# Patient Record
Sex: Male | Born: 1990 | Race: White | Hispanic: No | Marital: Single | Smoking: Never smoker
Health system: Southern US, Community
[De-identification: ages and names within clinical notes are randomized; demographics above are authoritative.]

## PROBLEM LIST (undated history)

## (undated) DIAGNOSIS — E669 Obesity, unspecified: Secondary | ICD-10-CM

## (undated) DIAGNOSIS — F329 Major depressive disorder, single episode, unspecified: Secondary | ICD-10-CM

## (undated) DIAGNOSIS — F419 Anxiety disorder, unspecified: Secondary | ICD-10-CM

## (undated) DIAGNOSIS — I10 Essential (primary) hypertension: Secondary | ICD-10-CM

## (undated) DIAGNOSIS — G4730 Sleep apnea, unspecified: Secondary | ICD-10-CM

## (undated) DIAGNOSIS — F840 Autistic disorder: Secondary | ICD-10-CM

## (undated) DIAGNOSIS — F32A Depression, unspecified: Secondary | ICD-10-CM

## (undated) HISTORY — DX: Obesity, unspecified: E66.9

## (undated) HISTORY — PX: NO PAST SURGERIES: SHX2092

## (undated) HISTORY — DX: Sleep apnea, unspecified: G47.30

## (undated) HISTORY — DX: Autistic disorder: F84.0

## (undated) HISTORY — DX: Depression, unspecified: F32.A

## (undated) HISTORY — DX: Essential (primary) hypertension: I10

## (undated) HISTORY — DX: Major depressive disorder, single episode, unspecified: F32.9

## (undated) HISTORY — DX: Anxiety disorder, unspecified: F41.9

---
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
## 2013-06-12 DIAGNOSIS — F840 Autistic disorder: Secondary | ICD-10-CM | POA: Insufficient documentation

## 2015-10-14 DIAGNOSIS — F419 Anxiety disorder, unspecified: Secondary | ICD-10-CM

## 2015-10-14 DIAGNOSIS — F329 Major depressive disorder, single episode, unspecified: Secondary | ICD-10-CM | POA: Insufficient documentation

## 2018-03-05 ENCOUNTER — Ambulatory Visit: Payer: 59 | Admitting: Physician Assistant

## 2018-03-05 ENCOUNTER — Encounter: Payer: Self-pay | Admitting: Physician Assistant

## 2018-03-05 VITALS — BP 158/96 | HR 76 | Temp 98.5°F | Resp 16 | Ht 72.0 in | Wt >= 6400 oz

## 2018-03-05 DIAGNOSIS — Z130 Encounter for screening for diseases of the blood and blood-forming organs and certain disorders involving the immune mechanism: Secondary | ICD-10-CM

## 2018-03-05 DIAGNOSIS — Z13220 Encounter for screening for lipoid disorders: Secondary | ICD-10-CM | POA: Diagnosis not present

## 2018-03-05 DIAGNOSIS — F329 Major depressive disorder, single episode, unspecified: Secondary | ICD-10-CM

## 2018-03-05 DIAGNOSIS — R29818 Other symptoms and signs involving the nervous system: Secondary | ICD-10-CM

## 2018-03-05 DIAGNOSIS — Z114 Encounter for screening for human immunodeficiency virus [HIV]: Secondary | ICD-10-CM

## 2018-03-05 DIAGNOSIS — F32A Depression, unspecified: Secondary | ICD-10-CM

## 2018-03-05 DIAGNOSIS — I10 Essential (primary) hypertension: Secondary | ICD-10-CM

## 2018-03-05 DIAGNOSIS — R7303 Prediabetes: Secondary | ICD-10-CM

## 2018-03-05 DIAGNOSIS — R5383 Other fatigue: Secondary | ICD-10-CM

## 2018-03-05 DIAGNOSIS — Z1329 Encounter for screening for other suspected endocrine disorder: Secondary | ICD-10-CM

## 2018-03-05 MED ORDER — AMLODIPINE BESYLATE 5 MG PO TABS: 5.0000 mg | ORAL_TABLET | Freq: Every day | ORAL | 3 refills | Status: DC

## 2018-03-05 MED ORDER — SERTRALINE HCL 50 MG PO TABS: 50.0000 mg | ORAL_TABLET | Freq: Every day | ORAL | 3 refills | Status: DC

## 2018-03-05 NOTE — Progress Notes (Signed)
Patient: Ryan Ellis Male    DOB: 08/24/1991   27 y.o.   MRN: [id number] Visit Date: 03/06/2018  Today's Provider: Trey SailorsAdriana M Pollak, PA-C   Chief Complaint  Patient presents with  . Establish Care   Subjective:    New patient here today. Last seen by PCP 2 years. [place], [place]. 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   Has previously lived in psychiatric living facility for 10 months and therapeutic foster home for 10 months. Was having emotional distress. Diagnosed with Asperger's at age 27 and had difficulty in home environment. [facility].   Prediabetes: Previously on metformin 1000 mg BID. Stopped taking two years. HTN: Used to be on 10 mg amlodipine, says he has had high blood pressure for 10 years.  Tetanus: Last given 2014, per patient  Depression and Anxiety: Previously on sertraline, did well on this medication. Previously on 200 mg QD zoloft.  Sleep apnea: Tested at age 27, reported not to have sleep apnea. Recently, has been snoring more frequently, trouble waking up, daytime fatigue.  Morbid Obesity: Reports his weight has always been high and weight loss has always been an issue for him. Lowest weight as an adult has been 344 in 2012. Has tried keto diet. Has considered bariatric surgery. Last weight 10/2015 was 398 lbs.   Hypertension  This is a chronic problem. The problem is unchanged. Associated symptoms include anxiety. There are no associated agents to hypertension.       Allergies  Allergen Reactions  . Amoxicillin Hives  . Cat Hair Extract     Other reaction(s): Unknown  . Cephalexin Hives  . Lamotrigine     Other reaction(s): Unknown SEVERE RASH THAT WAS LIKE SJ SYNDROME  . Lithium     Other reaction(s): Unknown Thyroid issues - increased?     Current Outpatient Medications:  .  amLODipine (NORVASC) 5 MG tablet, Take 1 tablet (5 mg total) by mouth daily., Disp: 90 tablet, Rfl: 3 .   sertraline (ZOLOFT) 50 MG tablet, Take 1 tablet (50 mg total) by mouth daily., Disp: 30 tablet, Rfl: 3  Review of Systems Past Surgical History:  Procedure Laterality Date  . NO PAST SURGERIES      Family History  Problem Relation Age of Onset  . Healthy Mother   . Seizures Father   . Hypertension Father   . Heart attack Maternal Grandmother   . Diabetes Maternal Grandmother   . Heart attack Maternal Grandfather     Social History   Tobacco Use  . Smoking status: Never Smoker  . Smokeless tobacco: Never Used  Substance Use Topics  . Alcohol use: Yes    Alcohol/week: 0.6 oz    Types: 1 Cans of beer per week   Objective:   BP (!) 158/96 (BP Location: Right Wrist, Patient Position: Sitting, Cuff Size: Normal)   Pulse 76   Temp 98.5 F (36.9 C) (Oral)   Resp 16   Ht 6' (1.829 m)   Wt (!) 467 lb (211.8 kg)   BMI 63.34 kg/m  Vitals:   03/05/18 1421  BP: (!) 158/96  Pulse: 76  Resp: 16  Temp: 98.5 F (36.9 C)  TempSrc: Oral  Weight: (!) 467 lb (211.8 kg)  Height: 6' (1.829 m)     Physical Exam  Constitutional: He is oriented to person, place, and time. He appears well-developed and well-nourished.  Obese.   HENT:  Right Ear: External ear normal.  Left Ear: External ear normal.  Mouth/Throat: No oropharyngeal exudate.  Eyes: Pupils are equal, round, and reactive to light. Conjunctivae are normal.  Neck: Neck supple.  Cardiovascular: Normal rate and regular rhythm.  Pulmonary/Chest: Effort normal and breath sounds normal.  Abdominal: Soft. Bowel sounds are normal.  Lymphadenopathy:    He has no cervical adenopathy.  Neurological: He is alert and oriented to person, place, and time.  Psychiatric: He has a normal mood and affect. His behavior is normal.        Assessment & Plan:     1. Hypertension, unspecified type  Restart amlodipine today. Will likely need to increase dose to 10 mg next visit. Needs EKG due to duration of HTN.   - amLODipine  (NORVASC) 5 MG tablet; Take 1 tablet (5 mg total) by mouth daily.  Dispense: 90 tablet; Refill: 3  2. Suspected sleep apnea  Order in lab study per patient preference. If taking a long time, will change to in home sleep study.  - Ambulatory referral to Sleep Studies  3. Screening for deficiency anemia  - CBC with Differential  4. Screening cholesterol level  - Lipid Profile  5. Encounter for screening for HIV  - HIV antibody (with reflex)  6. Thyroid disorder screening  - TSH  7. Morbid obesity (HCC)  Weigh is critical. Has gained about 70 lbs in two years, morbid obesity with resultant comorbid conditions. Discussed weight loss options including weight management clinic, Saxenda if prediabetic, appetite suppressants and consideration of bariatric surgery. Will touch base at 2 week follow up for blood pressure.  - HgB A1c - Comprehensive Metabolic Panel (CMET)  8. Prediabetes  - HgB A1c - Comprehensive Metabolic Panel (CMET)  9. Fatigue, unspecified type  - Ambulatory referral to Sleep Studies  10. Depression, unspecified depression type  Restart. Has been under psychiatric care and counseling in the past, likely will need consistent mental health provider.  - Ambulatory referral to Psychiatry - sertraline (ZOLOFT) 50 MG tablet; Take 1 tablet (50 mg total) by mouth daily.  Dispense: 30 tablet; Refill: 3  Return in about 2 weeks (around 03/19/2018) for HTN, EKG.  The entirety of the information documented in the History of Present Illness, Review of Systems and Physical Exam were personally obtained by me. Portions of this information were initially documented by Kavin Leech, CMA and reviewed by me for thoroughness and accuracy.         Trey Sailors, PA-C  [facility]

## 2018-03-06 ENCOUNTER — Encounter: Payer: Self-pay | Admitting: Physician Assistant

## 2018-03-07 LAB — CBC WITH DIFFERENTIAL/PLATELET
Basophils Absolute: 0 10*3/uL (ref 0.0–0.2)
Basos: 0 %
EOS (ABSOLUTE): 0.2 10*3/uL (ref 0.0–0.4)
Eos: 2 %
Hematocrit: 43 % (ref 37.5–51.0)
Hemoglobin: 15 g/dL (ref 13.0–17.7)
Immature Grans (Abs): 0 10*3/uL (ref 0.0–0.1)
Immature Granulocytes: 0 %
Lymphocytes Absolute: 2.8 10*3/uL (ref 0.7–3.1)
Lymphs: 36 %
MCH: 27.3 pg (ref 26.6–33.0)
MCHC: 34.9 g/dL (ref 31.5–35.7)
MCV: 78 fL — ABNORMAL LOW (ref 79–97)
Monocytes Absolute: 0.5 10*3/uL (ref 0.1–0.9)
Monocytes: 7 %
Neutrophils Absolute: 4.2 10*3/uL (ref 1.4–7.0)
Neutrophils: 55 %
Platelets: 216 10*3/uL (ref 150–450)
RBC: 5.5 x10E6/uL (ref 4.14–5.80)
RDW: 14.8 % (ref 12.3–15.4)
WBC: 7.8 10*3/uL (ref 3.4–10.8)

## 2018-03-07 LAB — COMPREHENSIVE METABOLIC PANEL
ALT: 41 IU/L (ref 0–44)
AST: 24 IU/L (ref 0–40)
Albumin/Globulin Ratio: 1.5 (ref 1.2–2.2)
Albumin: 4.3 g/dL (ref 3.5–5.5)
Alkaline Phosphatase: 64 IU/L (ref 39–117)
BUN/Creatinine Ratio: 14 (ref 9–20)
BUN: 10 mg/dL (ref 6–20)
Bilirubin Total: 0.4 mg/dL (ref 0.0–1.2)
CO2: 23 mmol/L (ref 20–29)
Calcium: 9.3 mg/dL (ref 8.7–10.2)
Chloride: 103 mmol/L (ref 96–106)
Creatinine, Ser: 0.71 mg/dL — ABNORMAL LOW (ref 0.76–1.27)
GFR calc Af Amer: 151 mL/min/{1.73_m2} (ref >59)
GFR calc non Af Amer: 130 mL/min/{1.73_m2} (ref >59)
Globulin, Total: 2.9 g/dL (ref 1.5–4.5)
Glucose: 96 mg/dL (ref 65–99)
Potassium: 4.4 mmol/L (ref 3.5–5.2)
Sodium: 142 mmol/L (ref 134–144)
Total Protein: 7.2 g/dL (ref 6.0–8.5)

## 2018-03-07 LAB — LIPID PANEL
Chol/HDL Ratio: 4.3 ratio (ref 0.0–5.0)
Cholesterol, Total: 180 mg/dL (ref 100–199)
HDL: 42 mg/dL (ref >39)
LDL Calculated: 118 mg/dL — ABNORMAL HIGH (ref 0–99)
Triglycerides: 100 mg/dL (ref 0–149)
VLDL Cholesterol Cal: 20 mg/dL (ref 5–40)

## 2018-03-07 LAB — HIV ANTIBODY (ROUTINE TESTING W REFLEX): HIV Screen 4th Generation wRfx: NONREACTIVE

## 2018-03-07 LAB — TSH: TSH: 1.67 u[IU]/mL (ref 0.450–4.500)

## 2018-03-07 LAB — HEMOGLOBIN A1C
Est. average glucose Bld gHb Est-mCnc: 108 mg/dL
Hgb A1c MFr Bld: 5.4 % (ref 4.8–5.6)

## 2018-03-11 ENCOUNTER — Telehealth: Payer: Self-pay

## 2018-03-11 NOTE — Telephone Encounter (Signed)
Pt returned call ° °teri °

## 2018-03-11 NOTE — Telephone Encounter (Signed)
LMTCB 03/11/2018  Thanks,   -Laura  

## 2018-03-11 NOTE — Telephone Encounter (Signed)
-----   Message from Trey SailorsAdriana M Pollak, [place]-C sent at 03/08/2018  8:45 AM EDT ----- A1c is normal, CMET normal. Cholesterol normal. CBC normal. HIV negative. TSH normal. See him back in clinic to check his blood pressure.

## 2018-03-11 NOTE — Telephone Encounter (Signed)
Left message advising pt.  (Per DPR)  Thanks,   -Laura  

## 2018-03-21 ENCOUNTER — Telehealth: Payer: Self-pay | Admitting: Physician Assistant

## 2018-03-21 ENCOUNTER — Encounter: Payer: Self-pay | Admitting: Physician Assistant

## 2018-03-21 ENCOUNTER — Ambulatory Visit: Payer: 59 | Admitting: Physician Assistant

## 2018-03-21 VITALS — BP 136/96 | HR 84 | Temp 98.8°F | Resp 16 | Wt >= 6400 oz

## 2018-03-21 DIAGNOSIS — R29818 Other symptoms and signs involving the nervous system: Secondary | ICD-10-CM

## 2018-03-21 DIAGNOSIS — F329 Major depressive disorder, single episode, unspecified: Secondary | ICD-10-CM

## 2018-03-21 DIAGNOSIS — F419 Anxiety disorder, unspecified: Secondary | ICD-10-CM

## 2018-03-21 DIAGNOSIS — I10 Essential (primary) hypertension: Secondary | ICD-10-CM

## 2018-03-21 MED ORDER — AMLODIPINE BESYLATE 10 MG PO TABS: 10.0000 mg | ORAL_TABLET | Freq: Every day | ORAL | 3 refills | Status: DC

## 2018-03-21 MED ORDER — SERTRALINE HCL 100 MG PO TABS: 100.0000 mg | ORAL_TABLET | Freq: Every day | ORAL | 0 refills | Status: DC

## 2018-03-21 NOTE — Telephone Encounter (Signed)
Pt stated that the he last saw Dr. Rowe Clackichard D'Alli with [facility] about 5 years ago but doesn't thinks provider is currently with [facility]. Pt stated that he was supposed to call back with the information and Ricki Rodriguezdriana is working on referring pt to another office for psychiatry. Please advise. Thanks TNP

## 2018-03-21 NOTE — Progress Notes (Signed)
Patient: Ryan Ellis Male    DOB: 10/30/1990   27 y.o.   MRN: [id number] Visit Date: 03/21/2018  Today's Provider: Trey SailorsAdriana M Pollak, PA-C   Chief Complaint  Patient presents with  . Hypertension  . Depression  . Anxiety   Subjective:     Hypertension  This is a chronic problem. Associated symptoms include anxiety. Pertinent negatives include no blurred vision, chest pain, headaches, malaise/fatigue, neck pain, orthopnea, palpitations, peripheral edema, PND, shortness of breath or sweats. There are no associated agents to hypertension. Risk factors for coronary artery disease include male gender, obesity and family history. Past treatments include nothing. There are no compliance problems.   Anxiety  Presents for follow-up visit. Symptoms include decreased concentration, depressed mood, excessive worry and nervous/anxious behavior. Patient reports no chest pain, confusion, dizziness, insomnia, nausea, palpitations, shortness of breath or suicidal ideas. The quality of sleep is fair. Nighttime awakenings: several.     BP Readings from Last 3 Encounters:  03/21/18 (!) 136/96  03/05/18 (!) 158/96   Wt Readings from Last 3 Encounters:  03/21/18 (!) 464 lb (210.5 kg)  03/05/18 (!) 467 lb (211.8 kg)       Allergies  Allergen Reactions  . Amoxicillin Hives  . Cat Hair Extract     Other reaction(s): Unknown  . Cephalexin Hives  . Lamotrigine     Other reaction(s): Unknown SEVERE RASH THAT WAS LIKE SJ SYNDROME  . Lithium     Other reaction(s): Unknown Thyroid issues - increased?     Current Outpatient Medications:  .  amLODipine (NORVASC) 10 MG tablet, Take 1 tablet (10 mg total) by mouth daily., Disp: 90 tablet, Rfl: 3 .  sertraline (ZOLOFT) 100 MG tablet, Take 1 tablet (100 mg total) by mouth daily., Disp: 90 tablet, Rfl: 0  Review of Systems  Constitutional: Negative.  Negative for malaise/fatigue.  Eyes: Negative for blurred vision.  Respiratory: Negative.   Negative for shortness of breath.   Cardiovascular: Negative.  Negative for chest pain, palpitations, orthopnea and PND.  Gastrointestinal: Negative.  Negative for nausea.  Musculoskeletal: Negative for neck pain.  Neurological: Negative for dizziness, light-headedness and headaches.  Psychiatric/Behavioral: Positive for decreased concentration, dysphoric mood and sleep disturbance. Negative for agitation, behavioral problems, confusion, hallucinations, self-injury and suicidal ideas. The patient is nervous/anxious. The patient does not have insomnia and is not hyperactive.     Social History   Tobacco Use  . Smoking status: Never Smoker  . Smokeless tobacco: Never Used  Substance Use Topics  . Alcohol use: Yes    Alcohol/week: 0.6 oz    Types: 1 Cans of beer per week   Objective:   BP (!) 136/96 (BP Location: Right Arm, Patient Position: Sitting, Cuff Size: Large)   Pulse 84   Temp 98.8 F (37.1 C) (Oral)   Resp 16   Wt (!) 464 lb (210.5 kg)   BMI 62.93 kg/m  Vitals:   03/21/18 1409  BP: (!) 136/96  Pulse: 84  Resp: 16  Temp: 98.8 F (37.1 C)  TempSrc: Oral  Weight: (!) 464 lb (210.5 kg)     Physical Exam  Constitutional: He is oriented to person, place, and time. He appears well-developed and well-nourished.  Cardiovascular: Normal rate and regular rhythm.  Pulmonary/Chest: Effort normal and breath sounds normal.  Musculoskeletal: Normal range of motion. He exhibits no edema.  Neurological: He is alert and oriented to person, place, and time.  Skin: Skin is warm and  dry.  Psychiatric: He has a normal mood and affect. His behavior is normal.        Assessment & Plan:     1. Essential hypertension  Improved but not controlled yet. Will increase amlodipine to 10 mg QD. EKG today looks normal with no LVH.   - EKG 12-Lead - amLODipine (NORVASC) 10 MG tablet; Take 1 tablet (10 mg total) by mouth daily.  Dispense: 90 tablet; Refill: 3  2. Anxiety and  depression  Increase zoloft to 100 mg. Tried to refer to psychiatry, they are requesting records before deciding whether or not to take this patient. We will have him follow up with a therapist as he feels this would be more helpful to him and for now I can manage his medications.  - Ambulatory referral to Psychology - sertraline (ZOLOFT) 100 MG tablet; Take 1 tablet (100 mg total) by mouth daily.  Dispense: 90 tablet; Refill: 0  3. Suspected sleep apnea  He is scheduled for a sleep study on 03/29/2018.   Return in about 1 month (around 04/21/2018) for HTN; anxiety and depression .  The entirety of the information documented in the History of Present Illness, Review of Systems and Physical Exam were personally obtained by me. Portions of this information were initially documented by Kavin Leech, CMA and reviewed by me for thoroughness and accuracy.          Trey Sailors, PA-C  [facility]

## 2018-03-21 NOTE — Patient Instructions (Signed)

## 2018-03-29 ENCOUNTER — Ambulatory Visit: Payer: 59 | Attending: Otolaryngology

## 2018-03-29 DIAGNOSIS — G4733 Obstructive sleep apnea (adult) (pediatric): Secondary | ICD-10-CM | POA: Diagnosis not present

## 2018-03-29 DIAGNOSIS — F5101 Primary insomnia: Secondary | ICD-10-CM | POA: Insufficient documentation

## 2018-03-29 DIAGNOSIS — G4730 Sleep apnea, unspecified: Secondary | ICD-10-CM | POA: Diagnosis present

## 2018-04-03 ENCOUNTER — Other Ambulatory Visit: Payer: Self-pay | Admitting: Physician Assistant

## 2018-04-03 DIAGNOSIS — G4730 Sleep apnea, unspecified: Secondary | ICD-10-CM | POA: Insufficient documentation

## 2018-04-16 ENCOUNTER — Telehealth: Payer: Self-pay | Admitting: Physician Assistant

## 2018-04-16 NOTE — Telephone Encounter (Signed)
Victorino DikeJennifer with [employer] called saying she faxed a letter of medical necessity for a C pap machine and pressure settings.  They have not recieved it back.  Can we confirm that we received the fax and can you sign and send it back in.    [place]side LakeJennifer with [employer] said this the 4th time she has requested the form.  CB to [employer]  [phone]  Thanks Barth Kirksteri

## 2018-04-18 NOTE — Telephone Encounter (Signed)
This has been faxed again.   Thanks,   -Vernona RiegerLaura

## 2018-04-19 ENCOUNTER — Ambulatory Visit: Payer: 59 | Admitting: Psychology

## 2018-04-19 ENCOUNTER — Ambulatory Visit: Payer: 59 | Admitting: Physician Assistant

## 2018-04-19 ENCOUNTER — Encounter: Payer: Self-pay | Admitting: Physician Assistant

## 2018-04-19 DIAGNOSIS — F840 Autistic disorder: Secondary | ICD-10-CM

## 2018-04-19 DIAGNOSIS — I10 Essential (primary) hypertension: Secondary | ICD-10-CM | POA: Diagnosis not present

## 2018-04-19 DIAGNOSIS — G4730 Sleep apnea, unspecified: Secondary | ICD-10-CM | POA: Diagnosis not present

## 2018-04-19 DIAGNOSIS — F329 Major depressive disorder, single episode, unspecified: Secondary | ICD-10-CM | POA: Diagnosis not present

## 2018-04-19 MED ORDER — LISINOPRIL-HYDROCHLOROTHIAZIDE 10-12.5 MG PO TABS: 1.0000 | ORAL_TABLET | Freq: Every day | ORAL | 0 refills | Status: DC

## 2018-04-19 NOTE — Patient Instructions (Signed)

## 2018-04-19 NOTE — Progress Notes (Signed)
Patient: Ryan Ellis Male    DOB: 12/19/1990   27 y.o.   MRN: [id number] Visit Date: 04/19/2018  Today's Provider: Trey SailorsAdriana M Pollak, PA-C   Chief Complaint  Patient presents with  . Hypertension  . Depression   Subjective:    Hypertension  This is a chronic problem. Associated symptoms include anxiety. Pertinent negatives include no blurred vision, headaches, malaise/fatigue, palpitations, peripheral edema or shortness of breath. There are no compliance problems.   Depression         This is a chronic problem.  Associated symptoms include fatigue, helplessness, hopelessness and restlessness.  Associated symptoms include no decreased concentration, does not have insomnia, not irritable, no decreased interest, no appetite change, no myalgias, no headaches, no indigestion, not sad and no suicidal ideas.  Past medical history includes anxiety.    Has missed one dose in the past week of amlodipine 10 mg. Otherwise doing well with the medication. Has lost ~ 10 pounds over the past two months.  Recently followed up with therapy, has appointment with psychiatrist.   Wt Readings from Last 3 Encounters:  04/19/18 (!) 458 lb (207.7 kg)  03/21/18 (!) 464 lb (210.5 kg)  03/05/18 (!) 467 lb (211.8 kg)   BP Readings from Last 3 Encounters:  04/19/18 (!) 148/96  03/21/18 (!) 136/96  03/05/18 (!) 158/96        Allergies  Allergen Reactions  . Amoxicillin Hives  . Cat Hair Extract     Other reaction(s): Unknown  . Cephalexin Hives  . Lamotrigine     Other reaction(s): Unknown SEVERE RASH THAT WAS LIKE SJ SYNDROME  . Lithium     Other reaction(s): Unknown Thyroid issues - increased?     Current Outpatient Medications:  .  amLODipine (NORVASC) 10 MG tablet, Take 1 tablet (10 mg total) by mouth daily., Disp: 90 tablet, Rfl: 3 .  sertraline (ZOLOFT) 100 MG tablet, Take 1 tablet (100 mg total) by mouth daily., Disp: 90 tablet, Rfl: 0  Review of Systems  Constitutional: Positive  for diaphoresis and fatigue. Negative for activity change, appetite change, chills, fever, malaise/fatigue and unexpected weight change.  Eyes: Negative for blurred vision.  Respiratory: Positive for apnea. Negative for cough, choking, chest tightness, shortness of breath, wheezing and stridor.   Cardiovascular: Negative.  Negative for palpitations.  Gastrointestinal: Negative.   Musculoskeletal: Negative for myalgias.  Neurological: Negative for dizziness, light-headedness and headaches.  Psychiatric/Behavioral: Positive for depression and dysphoric mood. Negative for agitation, behavioral problems, confusion, decreased concentration, hallucinations, self-injury, sleep disturbance and suicidal ideas. The patient is nervous/anxious. The patient does not have insomnia and is not hyperactive.     Social History   Tobacco Use  . Smoking status: Never Smoker  . Smokeless tobacco: Never Used  Substance Use Topics  . Alcohol use: Yes    Alcohol/week: 1.0 standard drinks    Types: 1 Cans of beer per week   Objective:   BP (!) 148/96 (BP Location: Right Wrist, Patient Position: Sitting, Cuff Size: Normal)   Pulse (!) 106   Temp 98.2 F (36.8 C)   Wt (!) 458 lb (207.7 kg)   SpO2 95%   BMI 62.12 kg/m  Vitals:   04/19/18 1426  BP: (!) 148/96  Pulse: (!) 106  Temp: 98.2 F (36.8 C)  SpO2: 95%  Weight: (!) 458 lb (207.7 kg)     Physical Exam  Constitutional: He is oriented to person, place, and time. He appears  well-developed and well-nourished. He is not irritable.  Cardiovascular: Normal rate and regular rhythm.  Pulmonary/Chest: Effort normal and breath sounds normal.  Neurological: He is alert and oriented to person, place, and time.  Skin: Skin is warm and dry.  Psychiatric: He has a normal mood and affect. His behavior is normal.        Assessment & Plan:     1. Morbid obesity [facility](HCC)  Bariatric surgery evaluation.  - Ambulatory referral to General Surgery  2.  Essential hypertension  Add medication.   - lisinopril-hydrochlorothiazide (PRINZIDE,ZESTORETIC) 10-12.5 MG tablet; Take 1 tablet by mouth daily.  Dispense: 90 tablet; Refill: 0  3. Sleep apnea, unspecified type  Sleep study showed OSA. In the process of ordering auto-titrating CPAP.   Return in about 1 month (around 05/20/2018) for HTN.  The entirety of the information documented in the History of Present Illness, Review of Systems and Physical Exam were personally obtained by me. Portions of this information were initially documented by Kavin LeechLaura Walsh, CMA and reviewed by me for thoroughness and accuracy.          Trey SailorsAdriana M Pollak, PA-C  [facility]

## 2018-04-22 ENCOUNTER — Telehealth: Payer: Self-pay | Admitting: Physician Assistant

## 2018-04-22 NOTE — Telephone Encounter (Signed)
Patient called saying someone called him for an appt. With a Therapist, sportssychiatrist for Wed. Aug. 21, 2019 at 12:00.  However, he does not know the name, address or phone # of the Doc. He is suppose to see and he has a mandatory meeting on Wed and cannot go to Psych appt.   Pt does not know how to get in touch with psych because they never emailed info to him as was instructed by the Psych office.     Is there anyway we can find out TODAY who the appt was with because he is trying to resch within the 24 hour period before being charged a NO SHOW.

## 2018-04-22 NOTE — Telephone Encounter (Signed)
Please advise.  I'm not sure who this pt is seeing.   Thanks,   -Vernona RiegerLaura

## 2018-04-22 NOTE — Telephone Encounter (Signed)
Scheduled with Dr. Elna BreslowEappen on 04/24/2018. Please give contact info for [facility] and have him reschedule.

## 2018-04-23 NOTE — Telephone Encounter (Signed)
Pt advised.   Thanks,   -Jayceion Lisenby  

## 2018-04-24 ENCOUNTER — Ambulatory Visit: Payer: Self-pay | Admitting: Psychiatry

## 2018-04-30 ENCOUNTER — Ambulatory Visit: Payer: 59 | Admitting: Psychology

## 2018-04-30 DIAGNOSIS — F3289 Other specified depressive episodes: Secondary | ICD-10-CM | POA: Diagnosis not present

## 2018-05-14 ENCOUNTER — Encounter: Payer: Self-pay | Admitting: Psychiatry

## 2018-05-14 ENCOUNTER — Ambulatory Visit: Payer: 59 | Admitting: Psychology

## 2018-05-14 ENCOUNTER — Ambulatory Visit: Payer: 59 | Admitting: Psychiatry

## 2018-05-14 VITALS — BP 155/82 | HR 83 | Temp 97.6°F | Ht 72.0 in | Wt >= 6400 oz

## 2018-05-14 DIAGNOSIS — F5105 Insomnia due to other mental disorder: Secondary | ICD-10-CM | POA: Diagnosis not present

## 2018-05-14 DIAGNOSIS — F411 Generalized anxiety disorder: Secondary | ICD-10-CM | POA: Diagnosis not present

## 2018-05-14 DIAGNOSIS — G4733 Obstructive sleep apnea (adult) (pediatric): Secondary | ICD-10-CM | POA: Insufficient documentation

## 2018-05-14 DIAGNOSIS — F3289 Other specified depressive episodes: Secondary | ICD-10-CM | POA: Diagnosis not present

## 2018-05-14 DIAGNOSIS — F840 Autistic disorder: Secondary | ICD-10-CM | POA: Diagnosis not present

## 2018-05-14 DIAGNOSIS — F331 Major depressive disorder, recurrent, moderate: Secondary | ICD-10-CM | POA: Diagnosis not present

## 2018-05-14 DIAGNOSIS — Z9989 Dependence on other enabling machines and devices: Secondary | ICD-10-CM

## 2018-05-14 MED ORDER — SERTRALINE HCL 25 MG PO TABS: 25.0000 mg | ORAL_TABLET | Freq: Every day | ORAL | 0 refills | Status: DC

## 2018-05-14 MED ORDER — ARIPIPRAZOLE 2 MG PO TABS: 2.0000 mg | ORAL_TABLET | Freq: Every day | ORAL | 1 refills | Status: DC

## 2018-05-14 MED ORDER — PROPRANOLOL HCL 10 MG PO TABS: 10.0000 mg | ORAL_TABLET | Freq: Two times a day (BID) | ORAL | 1 refills | Status: DC | PRN

## 2018-05-14 NOTE — Patient Instructions (Signed)
Aripiprazole tablets What is this medicine? ARIPIPRAZOLE (ay ri PIP ray zole) is an atypical antipsychotic. It is used to treat schizophrenia and bipolar disorder, also known as manic-depression. It is also used to treat Tourette's disorder and some symptoms of autism. This medicine may also be used in combination with antidepressants to treat major depressive disorder. This medicine may be used for other purposes; ask your health care provider or pharmacist if you have questions. COMMON BRAND NAME(S): Abilify What should I tell my health care provider before I take this medicine? They need to know if you have any of these conditions: -dehydration -dementia -diabetes -heart disease -history of stroke -low blood counts, like low white cell, platelet, or red cell counts -Parkinson's disease -seizures -suicidal thoughts, plans, or attempt; a previous suicide attempt by you or a family member -an unusual or allergic reaction to aripiprazole, other medicines, foods, dyes, or preservatives -pregnant or trying to get pregnant -breast-feeding How should I use this medicine? Take this medicine by mouth with a glass of water. Follow the directions on the prescription label. You can take this medicine with or without food. Take your doses at regular intervals. Do not take your medicine more often than directed. Do not stop taking except on the advice of your doctor or health care professional. A special MedGuide will be given to you by the pharmacist with each prescription and refill. Be sure to read this information carefully each time. Talk to your pediatrician regarding the use of this medicine in children. While this drug may be prescribed for children as young as 6 years of age for selected conditions, precautions do apply. Overdosage: If you think you have taken too much of this medicine contact a poison control center or emergency room at once. NOTE: This medicine is only for you. Do not share  this medicine with others. What if I miss a dose? If you miss a dose, take it as soon as you can. If it is almost time for your next dose, take only that dose. Do not take double or extra doses. What may interact with this medicine? Do not take this medicine with any of the following medications: -brexpiprazole -cisapride -dofetilide -dronedarone -metoclopramide -pimozide -thioridazine This medicine may also interact with the following medications: -alcohol -carbamazepine -certain medicines for anxiety or sleep -certain medicines for blood pressure -certain medicines for fungal infections like ketoconazole, fluconazole, posaconazole, and itraconazole -clarithromycin -fluoxetine -other medicines that prolong the QT interval (cause an abnormal heart rhythm) -paroxetine -quinidine -rifampin This list may not describe all possible interactions. Give your health care provider a list of all the medicines, herbs, non-prescription drugs, or dietary supplements you use. Also tell them if you smoke, drink alcohol, or use illegal drugs. Some items may interact with your medicine. What should I watch for while using this medicine? Visit your doctor or health care professional for regular checks on your progress. It may be several weeks before you see the full effects of this medicine. Do not suddenly stop taking this medicine. You may need to gradually reduce the dose. Patients and their families should watch out for worsening depression or thoughts of suicide. Also watch out for sudden changes in feelings such as feeling anxious, agitated, panicky, irritable, hostile, aggressive, impulsive, severely restless, overly excited and hyperactive, or not being able to sleep. If this happens, especially at the beginning of antidepressant treatment or after a change in dose, call your health care professional. You may get dizzy or drowsy. Do   not drive, use machinery, or do anything that needs mental  alertness until you know how this medicine affects you. Do not stand or sit up quickly, especially if you are an older patient. This reduces the risk of dizzy or fainting spells. Alcohol can increase dizziness and drowsiness. Avoid alcoholic drinks. This medicine can reduce the response of your body to heat or cold. Dress warm in cold weather and stay hydrated in hot weather. If possible, avoid extreme temperatures like saunas, hot tubs, very hot or cold showers, or activities that can cause dehydration such as vigorous exercise. This medicine may cause dry eyes and blurred vision. If you wear contact lenses you may feel some discomfort. Lubricating drops may help. See your eye doctor if the problem does not go away or is severe. If you notice an increased hunger or thirst, different from your normal hunger or thirst, or if you find that you have to urinate more frequently, you should contact your health care provider as soon as possible. You may need to have your blood sugar monitored. This medicine may cause changes in your blood sugar levels. You should monitor you blood sugar frequently if you have diabetes. There have been reports of uncontrollable and strong urges to gamble, binge eat, shop, and have sex while taking this medicine. If you experience any of these or other uncontrollable and strong urges while taking this medicine, you should report it to your health care provider as soon as possible. What side effects may I notice from receiving this medicine? Side effects that you should report to your doctor or health care professional as soon as possible: -allergic reactions like skin rash, itching or hives, swelling of the face, lips, or tongue -breathing problems -confusion -feeling faint or lightheaded, falls -fever or chills, sore throat -increased hunger or thirst -increased urination -joint pain -muscles pain, spasms -problems with balance, talking, walking -restlessness or need to  keep moving -seizures -suicidal thoughts or other mood changes -trouble swallowing -uncontrollable and excessive urges (examples: gambling, binge eating, shopping, having sex) -uncontrollable head, mouth, neck, arm, or leg movements -unusually weak or tired Side effects that usually do not require medical attention (report to your doctor or health care professional if they continue or are bothersome): -blurred vision -constipation -headache -nausea, vomiting -trouble sleeping -weight gain This list may not describe all possible side effects. Call your doctor for medical advice about side effects. You may report side effects to FDA at 1-800-FDA-1088. Where should I keep my medicine? Keep out of the reach of children. Store at room temperature between 15 and 30 degrees C (59 and 86 degrees F). Throw away any unused medicine after the expiration date. NOTE: This sheet is a summary. It may not cover all possible information. If you have questions about this medicine, talk to your doctor, pharmacist, or health care provider.  2018 Elsevier/Gold Standard (2016-08-06 11:45:05)  

## 2018-05-14 NOTE — Progress Notes (Signed)
Psychiatric Initial Adult Assessment   Patient Identification: Ryan Ellis MRN:  [id number] Date of Evaluation:  05/14/2018 Referral Source: Nettie Elm PA -C Chief Complaint:  ' I am here to establish care." Chief Complaint    Establish Care; Depression; Anxiety; Panic Attack; Stress; Fatigue     Visit Diagnosis:    ICD-10-CM   1. MDD (major depressive disorder), recurrent episode, moderate (HCC) F33.1 sertraline (ZOLOFT) 25 MG tablet    ARIPiprazole (ABILIFY) 2 MG tablet  2. GAD (generalized anxiety disorder) F41.1 ARIPiprazole (ABILIFY) 2 MG tablet  3. Autism spectrum disorder F84.0 ARIPiprazole (ABILIFY) 2 MG tablet  4. Insomnia due to mental condition F51.05     History of Present Illness:  Ryan Ellis is a 27 year old Caucasian male, single, employed, lives in [place], has a history of depression, anxiety disorder, autism spectrum disorder, OSA on CPAP, hypertension, morbid obesity, presented to the clinic today to establish care.  Patient reports he has been struggling with depression and anxiety since the past more than 10 years.  He reports he was started on Zoloft 4 years ago by [facility] psychiat[facility].  He reports his dosage was increased to 100 mg by his primary medical doctor 5 months ago.  He however reports he continues to struggle with depressive symptoms like sadness, lack of motivation, feeling lonely, negative self-image, inability to concentrate, sleep issues and so on.  He denies any suicidality.  He denies any perceptual disturbances.  He denies any homicidality.  He does report anxiety symptoms.  He describes his anxiety symptoms as generalized worry about things in general, feeling nervous or on edge, worrying too much about different things, trouble relaxing, being restless and so on.  He reports that the Zoloft may be helping to some extent with his anxiety symptoms since  his symptoms have improved since being on the higher dosage.  He also reports a history of panic attacks  when he feels extremely anxious has racing heart rate and feel nervous.  He reports he was prescribed propranolol as needed which helps to some extent.  He reports sleep is improved now that he has started using his CPAP for OSA.  He reports he was diagnosed with OSA 2 weeks ago and started using the CPAP.  He does report some mood lability.  He reports there are times when he has an Engineer, production and is unable to manage his mood symptoms.  He reports that does not happen very frequently however he believes it is also due to his autism spectrum disorder.  He has tried mood stabilizers like lithium, Lamictal, Depakote and Abilify in the past.  He has had allergic reaction to lithium and Lamictal and he does not think the Depakote worked.  He reports he is okay with trying the Abilify again.  He denies any manic or hypomanic symptoms.  He denies any perceptual disturbances.  He does report some traumatic experiences growing up.  He reports he was in a residential treatment facility years ago which was a traumatic experience for him.  He also reports relationship struggles with his parents growing up which was also traumatic to him.  He currently denies any significant PTSD symptoms.  He is currently in psychotherapy with therapist Ryan Ellis which he reports is working well.  He denies abusing any substances or alcohol or other illicit drugs.  Associated Signs/Symptoms: Depression Symptoms:  depressed mood, anhedonia, fatigue, difficulty concentrating, anxiety, loss of energy/fatigue, (Hypo) Manic Symptoms:  Labiality of Mood, Anxiety Symptoms:  Excessive Worry, Panic  Symptoms, Psychotic Symptoms:  denies PTSD Symptoms: Had a traumatic exposure:  as noted above  Past Psychiatric History: Patient reports at least 4 inpatient mental health admissions in the past. One of those admission was at a residential long-term treatment facility where he stayed for several months.  Patient  reports this was in [place] and was 10 years ago.  Patient denies any suicide attempts.  Patient used to be under the care of [facility] psychiat[facility] in the past.  Patient's medications were recently being managed by his primary medical doctor.  Previous Psychotropic Medications: Yes Zoloft, Depakote, Lamictal ( rash) , lithium ( Thyroid problems) ,abilify, propranolol  Substance Abuse History in the last 12 months:  No.  Consequences of Substance Abuse: Negative  Past Medical History:  Past Medical History:  Diagnosis Date  . Anxiety   . Autism   . Depression   . Hypertension   . Obesity     Past Surgical History:  Procedure Laterality Date  . NO PAST SURGERIES      Family Psychiatric History: Maternal great grandmother-schizophrenia, committed suicide, mother-mental illness, alcoholism, father-mental illness, sister-depression, paternal grandmother-mental illness, alcoholism.  Family History:  Family History  Problem Relation Age of Onset  . Healthy Mother   . Alcohol abuse Mother   . Anxiety disorder Mother   . Depression Mother   . Seizures Father   . Hypertension Father   . Anxiety disorder Father   . Depression Father   . Heart attack Maternal Grandmother   . Diabetes Maternal Grandmother   . Heart attack Maternal Grandfather   . Anxiety disorder Sister   . Depression Sister     Social History:   Social History   Socioeconomic History  . Marital status: Single    Spouse name: Not on file  . Number of children: 0  . Years of education: Not on file  . Highest education level: Associate degree: occupational, Scientist, product/process development, or vocational program  Occupational History  . Not on file  Social Needs  . Financial resource strain: Not hard at all  . Food insecurity:    Worry: Never true    Inability: Never true  . Transportation needs:    Medical: No    Non-medical: No  Tobacco Use  . Smoking status: Never Smoker  . Smokeless tobacco: Never Used  Substance  and Sexual Activity  . Alcohol use: Not Currently    Alcohol/week: 1.0 standard drinks    Types: 1 Cans of beer per week  . Drug use: Never  . Sexual activity: Not Currently  Lifestyle  . Physical activity:    Days per week: 0 days    Minutes per session: 0 min  . Stress: Not at all  Relationships  . Social connections:    Talks on phone: Once a week    Gets together: Once a week    Attends religious service: Never    Active member of club or organization: Yes    Attends meetings of clubs or organizations: More than 4 times per year    Relationship status: Never married  Other Topics Concern  . Not on file  Social History Narrative  . Not on file    Additional Social History: Pt is single.  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  He lives by self in [place].  He has both parents and sister who currently lives in [place].  He reports an okay relationship with them.  Allergies:   Allergies  Allergen Reactions  . Amoxicillin Hives  . Cat Hair Extract     Other reaction(s): Unknown  . Cephalexin Hives  . Lamotrigine     Other reaction(s): Unknown SEVERE RASH THAT WAS LIKE SJ SYNDROME  . Lithium     Other reaction(s): Unknown Thyroid issues - increased?    Metabolic Disorder Labs: Lab Results  Component Value Date   HGBA1C 5.4 03/06/2018   No results found for: PROLACTIN Lab Results  Component Value Date   CHOL 180 03/06/2018   TRIG 100 03/06/2018   HDL 42 03/06/2018   CHOLHDL 4.3 03/06/2018   LDLCALC 118 (H) 03/06/2018     Current Medications: Current Outpatient Medications  Medication Sig Dispense Refill  . amLODipine (NORVASC) 10 MG tablet Take 1 tablet (10 mg total) by mouth daily. 90 tablet 3  . ARIPiprazole (ABILIFY) 2 MG tablet Take 1 tablet (2 mg total) by mouth daily. For mood 30 tablet 1  . lisinopril-hydrochlorothiazide (PRINZIDE,ZESTORETIC) 10-12.5 MG tablet Take 1 tablet by mouth daily. 90  tablet 0  . propranolol (INDERAL) 10 MG tablet Take 1 tablet (10 mg total) by mouth 2 (two) times daily as needed. Only for severe anxiety attacks 60 tablet 1  . sertraline (ZOLOFT) 100 MG tablet Take 1 tablet (100 mg total) by mouth daily. 90 tablet 0  . sertraline (ZOLOFT) 25 MG tablet Take 1 tablet (25 mg total) by mouth daily. Combine with 100 mg 30 tablet 0   No current facility-administered medications for this visit.     Neurologic: Headache: No Seizure: No Paresthesias:No  Musculoskeletal: Strength & Muscle Tone: within normal limits Gait & Station: normal Patient leans: N/A  Psychiatric Specialty Exam: Review of Systems  Psychiatric/Behavioral: Positive for depression. The patient has insomnia.   All other systems reviewed and are negative.   Blood pressure (!) 155/82, pulse 83, temperature 97.6 F (36.4 C), temperature source Oral, height 6' (1.829 m), weight (!) 464 lb (210.5 kg).Body mass index is 62.93 kg/m.  General Appearance: Casual  Eye Contact:  Fair  Speech:  Normal Rate  Volume:  Normal  Mood:  Anxious and Dysphoric  Affect:  Depressed  Thought Process:  Goal Directed and Descriptions of Associations: Intact  Orientation:  Full (Time, Place, and Person)  Thought Content:  Logical  Suicidal Thoughts:  No  Homicidal Thoughts:  No  Memory:  Immediate;   Fair Recent;   Fair Remote;   Fair  Judgement:  Fair  Insight:  Fair  Psychomotor Activity:  Normal  Concentration:  Concentration: Fair and Attention Span: Fair  Recall:  [employer] of Knowledge:Fair  Language: Fair  Akathisia:  No  Handed:  Right  AIMS (if indicated): na  Assets:  Communication Skills Desire for Improvement Social Support  ADL's:  Intact  Cognition: WNL  Sleep:  improving    Treatment Plan Summary:Trayson is a 27 year old Caucasian male, single, lives in [place], employed, has a history of depression, anxiety, autism spectrum disorder, OSA on CPAP, hypertension, morbid  obesity, presented to the clinic today to establish care.  Patient is biologically predisposed given his multiple medical problems as well as history of autism spectrum disorder and family history of mental health problems.  Patient also has psychosocial stressors of relationship conflicts.  Patient however is highly motivated to stay on medications as well as continue psychotherapy.  He currently denies any substance abuse problems or suicidality and homicidality.  Plan as noted below. Medication management and  Plan as noted below Plan For MDD PHQ 9 equals 15 Increase Zoloft to 125 mg p.o. daily Add Abilify 2 mg p.o. daily Continue psychotherapy with therapist Ryan Ellis.  For GAD GAD 7 equals 10 Zoloft 125 mg p.o. daily Continue CBT  For insomnia Currently on CPAP for OSA which helps his sleep.  For panic symptoms Propanolol 10 mg p.o. twice daily as needed Continue CBT  For autism spectrum disorder Continue CBT Abilify for mood symptoms.  I have reviewed the following labs in [facility]- TSH-1.670, Lipid Panel-within normal limits, hemoglobin A1c- 5.4 -completed on 03/06/2018-all within normal limits  Pt with elevated blood pressure reading today we will continue to work with his primary medical doctor.  Follow-up in clinic in 4 weeks or sooner if needed.  More than 50 % of the time was spent for psychoeducation and supportive psychotherapy and care coordination.  This note was generated in part or whole with voice recognition software. Voice recognition is usually quite accurate but there are transcription errors that can and very often do occur. I apologize for any typographical errors that were not detected and corrected.      Jomarie Longs, MD 9/10/20191:29 PM

## 2018-05-28 ENCOUNTER — Ambulatory Visit: Payer: 59 | Admitting: Psychology

## 2018-05-28 DIAGNOSIS — F3289 Other specified depressive episodes: Secondary | ICD-10-CM | POA: Diagnosis not present

## 2018-06-10 ENCOUNTER — Encounter: Payer: Self-pay | Admitting: Physician Assistant

## 2018-06-10 ENCOUNTER — Ambulatory Visit: Payer: 59 | Admitting: Physician Assistant

## 2018-06-10 VITALS — BP 130/94 | HR 80 | Temp 98.0°F | Resp 20 | Ht 72.0 in | Wt >= 6400 oz

## 2018-06-10 DIAGNOSIS — Z23 Encounter for immunization: Secondary | ICD-10-CM | POA: Diagnosis not present

## 2018-06-10 DIAGNOSIS — I10 Essential (primary) hypertension: Secondary | ICD-10-CM

## 2018-06-10 DIAGNOSIS — G4733 Obstructive sleep apnea (adult) (pediatric): Secondary | ICD-10-CM | POA: Diagnosis not present

## 2018-06-10 DIAGNOSIS — H547 Unspecified visual loss: Secondary | ICD-10-CM

## 2018-06-10 DIAGNOSIS — Z9989 Dependence on other enabling machines and devices: Secondary | ICD-10-CM

## 2018-06-10 MED ORDER — LISINOPRIL-HYDROCHLOROTHIAZIDE 20-25 MG PO TABS: 1.0000 | ORAL_TABLET | Freq: Every day | ORAL | 3 refills | Status: DC

## 2018-06-10 NOTE — Patient Instructions (Signed)

## 2018-06-10 NOTE — Progress Notes (Signed)
Patient: Ryan Ellis Male    DOB: 1991/03/11   27 y.o.   MRN: [id number] Visit Date: 06/10/2018  Today's Provider: Trey Sailors, PA-C   Chief Complaint  Patient presents with  . Hypertension   Subjective:    HPI  Follow up for hypertension  The patient was last seen for this 1 months ago. Changes made at last visit include start lisinopril-HCTZ.  He reports excellent compliance with treatment. He feels that condition is Improved. He is not having side effects.  Patient denies chest pain, shortness of breath or swelling.   CPAP: received this machine about 5 months ago and reports using it every night. Reports it helps him sleep better. Reports less daytime fatigue, notices he is needing long naps less and less.   BP Readings from Last 3 Encounters:  06/10/18 (!) 130/94  04/19/18 (!) 148/96  03/21/18 (!) 136/96   Wt Readings from Last 3 Encounters:  06/10/18 (!) 461 lb (209.1 kg)  04/19/18 (!) 458 lb (207.7 kg)  03/21/18 (!) 464 lb (210.5 kg)   Anxiety and Depression: Saw Dr. Elna Breslow in the meantime and has been placed on Abilify and propranolol.   Bariatric Surgery: Plans to complete seminar in the new year as he is very busy with work currently. He has been referred to [facility].  ------------------------------------------------------------------------------------       Allergies  Allergen Reactions  . Amoxicillin Hives  . Cat Hair Extract     Other reaction(s): Unknown  . Cephalexin Hives  . Lamotrigine     Other reaction(s): Unknown SEVERE RASH THAT WAS LIKE SJ SYNDROME  . Lithium     Other reaction(s): Unknown Thyroid issues - increased?     Current Outpatient Medications:  .  amLODipine (NORVASC) 10 MG tablet, Take 1 tablet (10 mg total) by mouth daily., Disp: 90 tablet, Rfl: 3 .  ARIPiprazole (ABILIFY) 2 MG tablet, Take 1 tablet (2 mg total) by mouth daily. For mood, Disp: 30 tablet, Rfl: 1 .  lisinopril-hydrochlorothiazide  (PRINZIDE,ZESTORETIC) 10-12.5 MG tablet, Take 1 tablet by mouth daily., Disp: 90 tablet, Rfl: 0 .  propranolol (INDERAL) 10 MG tablet, Take 1 tablet (10 mg total) by mouth 2 (two) times daily as needed. Only for severe anxiety attacks, Disp: 60 tablet, Rfl: 1 .  sertraline (ZOLOFT) 100 MG tablet, Take 1 tablet (100 mg total) by mouth daily., Disp: 90 tablet, Rfl: 0 .  sertraline (ZOLOFT) 25 MG tablet, Take 1 tablet (25 mg total) by mouth daily. Combine with 100 mg, Disp: 30 tablet, Rfl: 0  Review of Systems  Constitutional: Negative.   Respiratory: Negative.   Cardiovascular: Negative.     Social History   Tobacco Use  . Smoking status: Never Smoker  . Smokeless tobacco: Never Used  Substance Use Topics  . Alcohol use: Not Currently    Alcohol/week: 1.0 standard drinks    Types: 1 Cans of beer per week   Objective:   BP (!) 130/94 (BP Location: Right Arm, Patient Position: Sitting, Cuff Size: Large)   Pulse 80   Temp 98 F (36.7 C) (Oral)   Resp 20   Ht 6' (1.829 m)   Wt (!) 461 lb (209.1 kg)   SpO2 95%   BMI 62.52 kg/m  Vitals:   06/10/18 1643  BP: (!) 130/94  Pulse: 80  Resp: 20  Temp: 98 F (36.7 C)  TempSrc: Oral  SpO2: 95%  Weight: (!) 461 lb (209.1  kg)  Height: 6' (1.829 m)     Physical Exam  Constitutional: He is oriented to person, place, and time. He appears well-developed and well-nourished.  Cardiovascular: Normal rate and regular rhythm.  Pulmonary/Chest: Effort normal and breath sounds normal.  Neurological: He is alert and oriented to person, place, and time.  Skin: Skin is warm and dry.  Psychiatric: He has a normal mood and affect. His behavior is normal.        Assessment & Plan:     1. Essential hypertension  Increase Prinzide as below.  - Comprehensive Metabolic Panel (CMET) - lisinopril-hydrochlorothiazide (PRINZIDE,ZESTORETIC) 20-25 MG tablet; Take 1 tablet by mouth daily.  Dispense: 90 tablet; Refill: 3  2. Need for influenza  vaccination  Administered today.  3. Morbid obesity (HCC)  Planning on completing seminar in new year and pursuing bariatric surgery.  4. OSA on CPAP  Reports compliance and improvement in symptoms.  5. Vision loss  - Ambulatory referral to Ophthalmology  Return in about 3 months (around 09/10/2018) for CPE, F/u .  The entirety of the information documented in the History of Present Illness, Review of Systems and Physical Exam were personally obtained by me. Portions of this information were initially documented by Rondel Baton, CMA and reviewed by me for thoroughness and accuracy.        Trey Sailors, PA-C  [facility]

## 2018-06-11 ENCOUNTER — Ambulatory Visit: Payer: 59 | Admitting: Psychiatry

## 2018-06-11 ENCOUNTER — Encounter: Payer: Self-pay | Admitting: Psychiatry

## 2018-06-11 ENCOUNTER — Other Ambulatory Visit: Payer: Self-pay

## 2018-06-11 VITALS — BP 154/91 | HR 76 | Temp 97.5°F | Wt >= 6400 oz

## 2018-06-11 DIAGNOSIS — F5105 Insomnia due to other mental disorder: Secondary | ICD-10-CM

## 2018-06-11 DIAGNOSIS — F840 Autistic disorder: Secondary | ICD-10-CM | POA: Diagnosis not present

## 2018-06-11 DIAGNOSIS — F331 Major depressive disorder, recurrent, moderate: Secondary | ICD-10-CM

## 2018-06-11 DIAGNOSIS — F411 Generalized anxiety disorder: Secondary | ICD-10-CM | POA: Diagnosis not present

## 2018-06-11 MED ORDER — ARIPIPRAZOLE 2 MG PO TABS: 2.0000 mg | ORAL_TABLET | Freq: Every day | ORAL | 0 refills | Status: DC

## 2018-06-11 MED ORDER — SERTRALINE HCL 25 MG PO TABS: 25.0000 mg | ORAL_TABLET | Freq: Every day | ORAL | 0 refills | Status: DC

## 2018-06-11 MED ORDER — SERTRALINE HCL 100 MG PO TABS: 100.0000 mg | ORAL_TABLET | Freq: Every day | ORAL | 0 refills | Status: DC

## 2018-06-11 NOTE — Progress Notes (Signed)
BH MD OP Progress Note  06/11/2018 4:17 PM Ryan Ellis  MRN:  [id number]  Chief Complaint: ' I am here for follow up." Chief Complaint    Follow-up     HPI: Ryan Ellis is a 27 year old Caucasian male, single, employed, lives in [place], has a history of depression, anxiety disorder, autism spectrum disorder, OSA on CPAP, hypertension, morbid obesity, presented to the clinic today for a follow-up visit.  Patient today reports he has been taking the Abilify and Zoloft and reports of medications is helpful.  He denies any significant side effects to the medication however reports he may have gained a couple of pounds since his last visit here.  He wonders if this is due to Abilify or not.  He reports his diet continues to be the same and he has not noticed an increased appetite .  He however reports he does not exercise regularly .  Discussed with patient about regular exercise, calorie counting and so on.  Discussed with patient if his weight continues to increase then we can either change his medication or add medications to help with antipsychotic-induced weight gain.  He reports he has tried medications like Metformin in the past which may have helped.  Patient continues to work with his therapist Mr. Hulan Amato.  He reports therapy appointments is going well.  He reports sleep is fair.  Reports he is compliant on his CPAP which helps.  He denies any suicidality or homicidality.  He continues to report work is going well.   Visit Diagnosis:    ICD-10-CM   1. MDD (major depressive disorder), recurrent episode, moderate (HCC) F33.1 ARIPiprazole (ABILIFY) 2 MG tablet    sertraline (ZOLOFT) 25 MG tablet   improving  2. GAD (generalized anxiety disorder) F41.1 ARIPiprazole (ABILIFY) 2 MG tablet  3. Insomnia due to mental condition F51.05 sertraline (ZOLOFT) 100 MG tablet  4. Autism spectrum disorder F84.0 ARIPiprazole (ABILIFY) 2 MG tablet    Past Psychiatric History: Reviewed past psychiatric  history from my progress note on 05/14/2018.  Past trials of Zoloft, Depakote, Lamictal-rash, lithium-thyroid problems, Abilify, propranolol.  Past Medical History:  Past Medical History:  Diagnosis Date  . Anxiety   . Autism   . Depression   . Hypertension   . Obesity     Past Surgical History:  Procedure Laterality Date  . NO PAST SURGERIES      Family Psychiatric History: Reviewed family psychiatric history from my progress note on 05/14/2018  Family History:  Family History  Problem Relation Age of Onset  . Healthy Mother   . Alcohol abuse Mother   . Anxiety disorder Mother   . Depression Mother   . Seizures Father   . Hypertension Father   . Anxiety disorder Father   . Depression Father   . Heart attack Maternal Grandmother   . Diabetes Maternal Grandmother   . Heart attack Maternal Grandfather   . Anxiety disorder Sister   . Depression Sister     Social History: Reviewed social history from my progress note on 05/14/2018 Social History   Socioeconomic History  . Marital status: Single    Spouse name: Not on file  . Number of children: 0  . Years of education: Not on file  . Highest education level: Associate degree: occupational, Scientist, product/process development, or vocational program  Occupational History  . Not on file  Social Needs  . Financial resource strain: Not hard at all  . Food insecurity:    Worry: Never true  Inability: Never true  . Transportation needs:    Medical: No    Non-medical: No  Tobacco Use  . Smoking status: Never Smoker  . Smokeless tobacco: Never Used  Substance and Sexual Activity  . Alcohol use: Not Currently    Alcohol/week: 1.0 standard drinks    Types: 1 Cans of beer per week  . Drug use: Never  . Sexual activity: Not Currently  Lifestyle  . Physical activity:    Days per week: 0 days    Minutes per session: 0 min  . Stress: Not at all  Relationships  . Social connections:    Talks on phone: Once a week    Gets together: Once a week     Attends religious service: Never    Active member of club or organization: Yes    Attends meetings of clubs or organizations: More than 4 times per year    Relationship status: Never married  Other Topics Concern  . Not on file  Social History Narrative  . Not on file    Allergies:  Allergies  Allergen Reactions  . Amoxicillin Hives  . Cat Hair Extract     Other reaction(s): Unknown  . Cephalexin Hives  . Lamotrigine     Other reaction(s): Unknown SEVERE RASH THAT WAS LIKE SJ SYNDROME  . Lithium     Other reaction(s): Unknown Thyroid issues - increased?    Metabolic Disorder Labs: Lab Results  Component Value Date   HGBA1C 5.4 03/06/2018   No results found for: PROLACTIN Lab Results  Component Value Date   CHOL 180 03/06/2018   TRIG 100 03/06/2018   HDL 42 03/06/2018   CHOLHDL 4.3 03/06/2018   LDLCALC 118 (H) 03/06/2018   Lab Results  Component Value Date   TSH 1.670 03/06/2018    Therapeutic Level Labs: No results found for: LITHIUM No results found for: VALPROATE No components found for:  CBMZ  Current Medications: Current Outpatient Medications  Medication Sig Dispense Refill  . amLODipine (NORVASC) 10 MG tablet Take 1 tablet (10 mg total) by mouth daily. 90 tablet 3  . ARIPiprazole (ABILIFY) 2 MG tablet Take 1 tablet (2 mg total) by mouth daily. For mood 90 tablet 0  . lisinopril-hydrochlorothiazide (PRINZIDE,ZESTORETIC) 20-25 MG tablet Take 1 tablet by mouth daily. 90 tablet 3  . propranolol (INDERAL) 10 MG tablet Take 1 tablet (10 mg total) by mouth 2 (two) times daily as needed. Only for severe anxiety attacks 60 tablet 1  . sertraline (ZOLOFT) 100 MG tablet Take 1 tablet (100 mg total) by mouth daily. To be combined with 25 mg 90 tablet 0  . sertraline (ZOLOFT) 25 MG tablet Take 1 tablet (25 mg total) by mouth daily. Combine with 100 mg 90 tablet 0   No current facility-administered medications for this visit.      Musculoskeletal: Strength  & Muscle Tone: within normal limits Gait & Station: normal Patient leans: N/A  Psychiatric Specialty Exam: Review of Systems  Psychiatric/Behavioral: Positive for depression. The patient is nervous/anxious.   All other systems reviewed and are negative.   Blood pressure (!) 154/91, pulse 76, temperature (!) 97.5 F (36.4 C), temperature source Oral, weight (!) 467 lb (211.8 kg).Body mass index is 63.34 kg/m.  General Appearance: Casual  Eye Contact:  Fair  Speech:  Normal Rate  Volume:  Normal  Mood:  Anxious and Depressed improving  Affect:  Congruent  Thought Process:  Goal Directed and Descriptions of Associations: Intact  Orientation:  Full (Time, Place, and Person)  Thought Content: Logical   Suicidal Thoughts:  No  Homicidal Thoughts:  No  Memory:  Immediate;   Fair Recent;   Fair Remote;   Fair  Judgement:  Fair  Insight:  Fair  Psychomotor Activity:  Normal  Concentration:  Concentration: Fair and Attention Span: Fair  Recall:  [employer] of Knowledge: Fair  Language: Fair  Akathisia:  No  Handed:  Right  AIMS (if indicated): na  Assets:  Communication Skills Desire for Improvement Housing Talents/Skills Transportation  ADL's:  Intact  Cognition: WNL  Sleep:  improved   Screenings: PHQ2-9     Office Visit from 03/05/2018 in [facility]  PHQ-2 Total Score  4  PHQ-9 Total Score  17       Assessment and Plan: Carlito is a 27 year old Caucasian male, single, lives in [place], employed, has a history of depression, anxiety, autism spectrum disorder, OSA on CPAP, hypertension, morbid obesity, presented to the clinic today for a follow-up visit.  Patient is biologically predisposed given his multiple medical problems, history of autism spectrum disorder, family history of mental health problems.  He also has psychosocial stressors of relationship struggles.  He will continue to work with his therapist which is going well.  He will also continue  medication management as noted below.  Plan For MDD Continue Zoloft 125 mg p.o. daily Abilify 2 mg p.o. daily. Continue psychotherapy with therapist Mr.Mustafa  For GAD Zoloft 125 mg p.o. daily  For insomnia Patient reports sleep is improved.  He is compliant on his CPAP for OSA.  Panic symptoms Propanolol 10 mg p.o. twice daily as needed Continue CBT  For autism spectrum disorder Continue CBT. Abilify for mood symptoms  Discussed with patient about weight management by exercise, calorie counting.  Also discussed medication options if he continues to gain weight.  He will monitor himself closely and let writer know.  Follow-up in clinic in 6 weeks or sooner if needed.  More than 50 % of the time was spent for psychoeducation and supportive psychotherapy and care coordination.  This note was generated in part or whole with voice recognition software. Voice recognition is usually quite accurate but there are transcription errors that can and very often do occur. I apologize for any typographical errors that were not detected and corrected.         Jomarie Longs, MD 06/11/2018, 4:17 PM

## 2018-06-11 NOTE — Patient Instructions (Signed)

## 2018-06-18 ENCOUNTER — Ambulatory Visit: Payer: 59 | Admitting: Psychology

## 2018-06-18 DIAGNOSIS — F3289 Other specified depressive episodes: Secondary | ICD-10-CM

## 2018-07-02 ENCOUNTER — Ambulatory Visit: Payer: 59 | Admitting: Psychology

## 2018-07-02 DIAGNOSIS — F3289 Other specified depressive episodes: Secondary | ICD-10-CM

## 2018-07-03 ENCOUNTER — Encounter: Payer: Self-pay | Admitting: Physician Assistant

## 2018-08-06 ENCOUNTER — Ambulatory Visit: Payer: 59 | Admitting: Psychiatry

## 2018-08-06 ENCOUNTER — Ambulatory Visit: Payer: 59 | Admitting: Psychology

## 2018-08-06 ENCOUNTER — Encounter: Payer: Self-pay | Admitting: Psychiatry

## 2018-08-06 ENCOUNTER — Other Ambulatory Visit: Payer: Self-pay

## 2018-08-06 DIAGNOSIS — F840 Autistic disorder: Secondary | ICD-10-CM

## 2018-08-06 DIAGNOSIS — F411 Generalized anxiety disorder: Secondary | ICD-10-CM

## 2018-08-06 DIAGNOSIS — F5105 Insomnia due to other mental disorder: Secondary | ICD-10-CM

## 2018-08-06 DIAGNOSIS — F3341 Major depressive disorder, recurrent, in partial remission: Secondary | ICD-10-CM | POA: Diagnosis not present

## 2018-08-06 DIAGNOSIS — F3289 Other specified depressive episodes: Secondary | ICD-10-CM | POA: Diagnosis not present

## 2018-08-06 NOTE — Progress Notes (Signed)
BH MD  OP Progress Note  08/06/2018 2:29 PM Ryan Ellis  MRN:  [id number]  Chief Complaint: ' I am here for follow up.' Chief Complaint    Follow-up; Medication Refill     HPI: Ryan Ellis is a 27 year old Caucasian male, single, employed, lives in [place], has a history of depression, anxiety disorder, autism spectrum disorder, OSA on CPAP, hypertension, morbid obesity, presented to the clinic today for a follow-up visit.    Patient today reports he is currently doing well on the current medication regimen.  He reports he has been compliant with his Abilify and Zoloft.  He reports his mood symptoms as improved.  He does have a lot of stress at work since he is managing new projects.  Patient however reports he has been able to better cope with it.  Patient reports he has noticed some reduced appetite and hence he has to remind himself to eat at times.  He reports he is not worried about that since he has been making some lifestyle changes and wants to lose some weight.  He reports he is going to set up time for himself to eat 3 regular meals every day so that he is not snacking at the wrong time of the day.  Patient denies any suicidality.  Patient denies any perceptual disturbances.  Patient denies any side effects to the medications and wants to stay on the current dosage.   Visit Diagnosis:    ICD-10-CM   1. MDD (major depressive disorder), recurrent, in partial remission (HCC) F33.41   2. GAD (generalized anxiety disorder) F41.1    improving  3. Autism spectrum disorder F84.0   4. Insomnia due to mental condition F51.05    improving    Past Psychiatric History: Reviewed past psychiatric history from my progress note on 05/14/2018.  Past trials of Zoloft, Depakote, Lamictal-rash, lithium-thyroid problems, Abilify, propranolol.      Past Medical History:  Past Medical History:  Diagnosis Date  . Anxiety   . Autism   . Depression   . Hypertension   . Obesity     Past Surgical  History:  Procedure Laterality Date  . NO PAST SURGERIES      Family Psychiatric History: Reviewed family psychiatric history from my progress note on 05/14/2018.  Family History:  Family History  Problem Relation Age of Onset  . Healthy Mother   . Alcohol abuse Mother   . Anxiety disorder Mother   . Depression Mother   . Seizures Father   . Hypertension Father   . Anxiety disorder Father   . Depression Father   . Heart attack Maternal Grandmother   . Diabetes Maternal Grandmother   . Heart attack Maternal Grandfather   . Anxiety disorder Sister   . Depression Sister     Social History: Reviewed social history from my progress note on 05/14/2018. Social History   Socioeconomic History  . Marital status: Single    Spouse name: Not on file  . Number of children: 0  . Years of education: Not on file  . Highest education level: Associate degree: occupational, Scientist, product/process development, or vocational program  Occupational History  . Not on file  Social Needs  . Financial resource strain: Not hard at all  . Food insecurity:    Worry: Never true    Inability: Never true  . Transportation needs:    Medical: No    Non-medical: No  Tobacco Use  . Smoking status: Never Smoker  . Smokeless tobacco: Never  Used  Substance and Sexual Activity  . Alcohol use: Not Currently    Alcohol/week: 1.0 standard drinks    Types: 1 Cans of beer per week  . Drug use: Never  . Sexual activity: Not Currently  Lifestyle  . Physical activity:    Days per week: 0 days    Minutes per session: 0 min  . Stress: Not at all  Relationships  . Social connections:    Talks on phone: Once a week    Gets together: Once a week    Attends religious service: Never    Active member of club or organization: Yes    Attends meetings of clubs or organizations: More than 4 times per year    Relationship status: Never married  Other Topics Concern  . Not on file  Social History Narrative  . Not on file     Allergies:  Allergies  Allergen Reactions  . Amoxicillin Hives  . Cat Hair Extract     Other reaction(s): Unknown  . Cephalexin Hives  . Lamotrigine     Other reaction(s): Unknown SEVERE RASH THAT WAS LIKE SJ SYNDROME  . Lithium     Other reaction(s): Unknown Thyroid issues - increased?    Metabolic Disorder Labs: Lab Results  Component Value Date   HGBA1C 5.4 03/06/2018   No results found for: PROLACTIN Lab Results  Component Value Date   CHOL 180 03/06/2018   TRIG 100 03/06/2018   HDL 42 03/06/2018   CHOLHDL 4.3 03/06/2018   LDLCALC 118 (H) 03/06/2018   Lab Results  Component Value Date   TSH 1.670 03/06/2018    Therapeutic Level Labs: No results found for: LITHIUM No results found for: VALPROATE No components found for:  CBMZ  Current Medications: Current Outpatient Medications  Medication Sig Dispense Refill  . amLODipine (NORVASC) 10 MG tablet Take 1 tablet (10 mg total) by mouth daily. 90 tablet 3  . ARIPiprazole (ABILIFY) 2 MG tablet Take 1 tablet (2 mg total) by mouth daily. For mood 90 tablet 0  . lisinopril-hydrochlorothiazide (PRINZIDE,ZESTORETIC) 20-25 MG tablet Take 1 tablet by mouth daily. 90 tablet 3  . propranolol (INDERAL) 10 MG tablet Take 1 tablet (10 mg total) by mouth 2 (two) times daily as needed. Only for severe anxiety attacks 60 tablet 1  . sertraline (ZOLOFT) 100 MG tablet Take 1 tablet (100 mg total) by mouth daily. To be combined with 25 mg 90 tablet 0  . sertraline (ZOLOFT) 25 MG tablet Take 1 tablet (25 mg total) by mouth daily. Combine with 100 mg 90 tablet 0   No current facility-administered medications for this visit.      Musculoskeletal: Strength & Muscle Tone: within normal limits Gait & Station: normal Patient leans: N/A  Psychiatric Specialty Exam: Review of Systems  Psychiatric/Behavioral: Negative for depression. The patient is not nervous/anxious.   All other systems reviewed and are negative.   Blood  pressure (!) 155/92, pulse 96, temperature 97.8 F (36.6 C), temperature source Oral, weight (!) 461 lb (209.1 kg).Body mass index is 62.52 kg/m.  General Appearance: Casual  Eye Contact:  Fair  Speech:  Clear and Coherent  Volume:  Normal  Mood:  Euthymic  Affect:  Congruent  Thought Process:  Goal Directed and Descriptions of Associations: Intact  Orientation:  Full (Time, Place, and Person)  Thought Content: Logical   Suicidal Thoughts:  No  Homicidal Thoughts:  No  Memory:  Immediate;   Fair Recent;   Fair Remote;  Fair  Judgement:  Fair  Insight:  Fair  Psychomotor Activity:  Normal  Concentration:  Concentration: Fair and Attention Span: Fair  Recall:  [employer] of Knowledge: Fair  Language: Fair  Akathisia:  No  Handed:  Right  AIMS (if indicated): denies tremors, rigidity,stiffness  Assets:  Communication Skills Desire for Improvement Social Support  ADL's:  Intact  Cognition: WNL  Sleep:  Fair   Screenings: PHQ2-9     Office Visit from 03/05/2018 in [facility]  PHQ-2 Total Score  4  PHQ-9 Total Score  17       Assessment and Plan:  Hulet is a 27 year old Caucasian male, single, lives in [place], employed, has a history of depression, anxiety, autism spectrum disorder, OSA on CPAP, hypertension, morbid obesity, presented to the clinic today for a follow-up visit.  Patient is biologically predisposed given his multiple medical problems, history of autism spectrum disorder, family history of mental health problems.  He also has psychosocial stressors of relationship struggles.  Patient however reports he is currently improving on the current medication regimen.  He denies any suicidality or perceptual disturbances.  We will continue medications as noted below.  Plan For MDD Continue Zoloft 125 mg p.o. daily. Abilify 2 mg p.o. daily. Continue psychotherapy with therapist Mr. Mostafa.  GAD Zoloft 125 mg p.o. daily.  For insomnia Patient  reports sleep is improved.  He is compliant on his CPAP for OSA.  Panic symptoms Propranolol 10 mg p.o. twice daily as needed Continue CBT  For autism spectrum disorder Continue CBT. Abilify for mood symptoms.  Follow-up in clinic in 3 months or sooner if needed.  More than 50 % of the time was spent for psychoeducation and supportive psychotherapy and care coordination.  This note was generated in part or whole with voice recognition software. Voice recognition is usually quite accurate but there are transcription errors that can and very often do occur. I apologize for any typographical errors that were not detected and corrected.            Jomarie Longs, MD 08/06/2018, 2:29 PM

## 2018-09-10 ENCOUNTER — Encounter: Payer: Self-pay | Admitting: Physician Assistant

## 2018-09-10 ENCOUNTER — Ambulatory Visit: Payer: 59 | Admitting: Psychology

## 2018-09-10 DIAGNOSIS — F3289 Other specified depressive episodes: Secondary | ICD-10-CM

## 2018-09-30 ENCOUNTER — Ambulatory Visit (INDEPENDENT_AMBULATORY_CARE_PROVIDER_SITE_OTHER): Payer: 59 | Admitting: Physician Assistant

## 2018-09-30 ENCOUNTER — Encounter: Payer: Self-pay | Admitting: Physician Assistant

## 2018-09-30 ENCOUNTER — Other Ambulatory Visit: Payer: Self-pay

## 2018-09-30 VITALS — BP 137/84 | HR 81 | Temp 97.8°F | Resp 16 | Ht 72.0 in | Wt >= 6400 oz

## 2018-09-30 DIAGNOSIS — G4733 Obstructive sleep apnea (adult) (pediatric): Secondary | ICD-10-CM | POA: Diagnosis not present

## 2018-09-30 DIAGNOSIS — I10 Essential (primary) hypertension: Secondary | ICD-10-CM

## 2018-09-30 DIAGNOSIS — Z9989 Dependence on other enabling machines and devices: Secondary | ICD-10-CM | POA: Diagnosis not present

## 2018-09-30 DIAGNOSIS — Z0000 Encounter for general adult medical examination without abnormal findings: Secondary | ICD-10-CM

## 2018-09-30 NOTE — Progress Notes (Signed)
Patient: Ryan Ellis, Male    DOB: 01/24/1991, 28 y.o.   MRN: [id number] Visit Date: 09/30/2018  Today's Provider: Trey Sailors, PA-C   Chief Complaint  Patient presents with  . Annual Exam   Subjective:    Annual physical exam Ryan Ellis is a 28 y.o. male who presents today for health maintenance and complete physical. He feels well. He reports exercising none. He reports he is sleeping well.  Tetanus shot: UTD 2011 Prostate cancer/colon cancer hx: none in family history   Sleep Apnea: Compliant with CPAP machine every night with big improvement in daytime fatigue.   Depression: He sees Dr. Elna Breslow and is taking abilify 2 mg daily and zoloft 125 mg daily. He is also undergoing cognitive behavioral therapy regularly. He reports he feels better mood wise. He is also prescribed propranolol 10 mg BID PRN but reports he hasn't needed to take this.  Morbid Obesity: Patient was referred to [place] for bariatric surgery evaluation last year but he was not prepared to complete the required seminar just yet. He reports he is ready to complete it now and would like to be referred again. He has gained 12 pounds since August 2019 and currently weighs 470 lbs with a BMI of 63.   Wt Readings from Last 3 Encounters:  09/30/18 (!) 470 lb (213.2 kg)  06/10/18 (!) 461 lb (209.1 kg)  04/19/18 (!) 458 lb (207.7 kg)    HTN: He continues to take amlodipine 10 mg daily and Prinzide 20-25 mg daily. Reports no issues taking medications.   BP Readings from Last 3 Encounters:  09/30/18 137/84  08/06/18 (!) 155/92  06/11/18 (!) 154/91  -----------------------------------------------------------------   Review of Systems  Constitutional: Positive for appetite change.  HENT: Positive for sneezing.   Eyes: Negative.   Respiratory: Negative.   Cardiovascular: Negative.   Gastrointestinal: Negative.   Endocrine: Negative.   Genitourinary: Negative.   Musculoskeletal: Positive for  back pain.  Skin: Negative.   Allergic/Immunologic: Negative.   Neurological: Negative.   Hematological: Negative.   Psychiatric/Behavioral: Negative.     Social History      He  reports that he has never smoked. He has never used smokeless tobacco. He reports previous alcohol use of about 1.0 standard drinks of alcohol per week. He reports that he does not use drugs.       Social History   Socioeconomic History  . Marital status: Single    Spouse name: Not on file  . Number of children: 0  . Years of education: Not on file  . Highest education level: Associate degree: occupational, Scientist, product/process development, or vocational program  Occupational History  . Not on file  Social Needs  . Financial resource strain: Not hard at all  . Food insecurity:    Worry: Never true    Inability: Never true  . Transportation needs:    Medical: No    Non-medical: No  Tobacco Use  . Smoking status: Never Smoker  . Smokeless tobacco: Never Used  Substance and Sexual Activity  . Alcohol use: Not Currently    Alcohol/week: 1.0 standard drinks    Types: 1 Cans of beer per week  . Drug use: Never  . Sexual activity: Not Currently  Lifestyle  . Physical activity:    Days per week: 0 days    Minutes per session: 0 min  . Stress: Not at all  Relationships  . Social connections:    Talks  on phone: Once a week    Gets together: Once a week    Attends religious service: Never    Active member of club or organization: Yes    Attends meetings of clubs or organizations: More than 4 times per year    Relationship status: Never married  Other Topics Concern  . Not on file  Social History Narrative  . Not on file    Past Medical History:  Diagnosis Date  . Anxiety   . Autism   . Depression   . Hypertension   . Obesity      Patient Active Problem List   Diagnosis Date Noted  . OSA on CPAP 05/14/2018  . Morbid obesity (HCC) 04/03/2018  . Sleep apnea 04/03/2018  . Hypertension 03/21/2018  . Anxiety  and depression 10/14/2015  . Autism spectrum disorder 06/12/2013  . Family discord 12/02/2011    Past Surgical History:  Procedure Laterality Date  . NO PAST SURGERIES      Family History        Family Status  Relation Name Status  . Mother  Alive  . Father  Alive  . MGM  Deceased  . MGF  Deceased  . Sister  Alive        His family history includes Alcohol abuse in his mother; Anxiety disorder in his father, mother, and sister; Depression in his father, mother, and sister; Diabetes in his maternal grandmother; Healthy in his mother; Heart attack in his maternal grandfather and maternal grandmother; Hypertension in his father; Seizures in his father.      Allergies  Allergen Reactions  . Amoxicillin Hives  . Cat Hair Extract     Other reaction(s): Unknown  . Cephalexin Hives  . Lamotrigine     Other reaction(s): Unknown SEVERE RASH THAT WAS LIKE SJ SYNDROME  . Lithium     Other reaction(s): Unknown Thyroid issues - increased?     Current Outpatient Medications:  .  amLODipine (NORVASC) 10 MG tablet, Take 1 tablet (10 mg total) by mouth daily., Disp: 90 tablet, Rfl: 3 .  ARIPiprazole (ABILIFY) 2 MG tablet, Take 1 tablet (2 mg total) by mouth daily. For mood, Disp: 90 tablet, Rfl: 0 .  lisinopril-hydrochlorothiazide (PRINZIDE,ZESTORETIC) 20-25 MG tablet, Take 1 tablet by mouth daily., Disp: 90 tablet, Rfl: 3 .  propranolol (INDERAL) 10 MG tablet, Take 1 tablet (10 mg total) by mouth 2 (two) times daily as needed. Only for severe anxiety attacks, Disp: 60 tablet, Rfl: 1 .  sertraline (ZOLOFT) 100 MG tablet, Take 1 tablet (100 mg total) by mouth daily. To be combined with 25 mg, Disp: 90 tablet, Rfl: 0 .  sertraline (ZOLOFT) 25 MG tablet, Take 1 tablet (25 mg total) by mouth daily. Combine with 100 mg, Disp: 90 tablet, Rfl: 0   Patient Care Team: Maryella Shivers as PCP - General (Physician Assistant)      Objective:   Vitals: BP 137/84 (BP Location: Left Arm,  Patient Position: Sitting, Cuff Size: Normal)   Pulse 81   Temp 97.8 F (36.6 C) (Oral)   Resp 16   Ht 6' (1.829 m)   Wt (!) 470 lb (213.2 kg)   SpO2 96%   BMI 63.74 kg/m    Vitals:   09/30/18 1509  BP: 137/84  Pulse: 81  Resp: 16  Temp: 97.8 F (36.6 C)  TempSrc: Oral  SpO2: 96%  Weight: (!) 470 lb (213.2 kg)  Height: 6' (1.829 m)  Physical Exam Constitutional:      Appearance: Normal appearance. He is obese.  Cardiovascular:     Rate and Rhythm: Normal rate and regular rhythm.     Heart sounds: Normal heart sounds.  Pulmonary:     Effort: Pulmonary effort is normal.     Breath sounds: Normal breath sounds.  Skin:    General: Skin is warm and dry.  Neurological:     Mental Status: He is alert. Mental status is at baseline.  Psychiatric:        Mood and Affect: Mood normal.        Behavior: Behavior normal.      Depression Screen PHQ 2/9 Scores 03/05/2018  PHQ - 2 Score 4  PHQ- 9 Score 17      Assessment & Plan:     Routine Health Maintenance and Physical Exam  Exercise Activities and Dietary recommendations Goals   None     Immunization History  Administered Date(s) Administered  . Influenza,inj,Quad PF,6+ Mos 06/10/2018  . Influenza-Unspecified 07/18/2011, 07/29/2012, 07/06/2015    Health Maintenance  Topic Date Due  . Janet BerlinETANUS/TDAP  11/03/2011  . INFLUENZA VACCINE  Completed  . HIV Screening  Completed     Discussed health benefits of physical activity, and encouraged him to engage in regular exercise appropriate for his age and condition.    1. Annual physical exam  No new family history. Tetanus shot up to date.   - CBC with Differential  2. Morbid obesity (HCC)  Reviewed bariatric surgery options, patient would like to proceed with referral and is ready to complete seminar.   - Ambulatory referral to General Surgery - Lipid Profile - TSH - HgB A1c  3. Essential hypertension  Controlled today, continue current  medication.  - Comprehensive Metabolic Panel (CMET)  4. OSA on CPAP  Provides relief from daytime fatigue, continue using cpap nightly.   Return in about 4 months (around 01/29/2019) for htn.  The entirety of the information documented in the History of Present Illness, Review of Systems and Physical Exam were personally obtained by me. Portions of this information were initially documented by Rondel BatonSulibeya Dimas, CMA and reviewed by me for thoroughness and accuracy.    --------------------------------------------------------------------    Trey SailorsAdriana M Pollak, PA-C  [facility]

## 2018-09-30 NOTE — Patient Instructions (Signed)

## 2018-10-01 LAB — COMPREHENSIVE METABOLIC PANEL
ALT: 34 IU/L (ref 0–44)
AST: 20 IU/L (ref 0–40)
Albumin/Globulin Ratio: 1.8 (ref 1.2–2.2)
Albumin: 4.7 g/dL (ref 4.1–5.2)
Alkaline Phosphatase: 60 IU/L (ref 39–117)
BUN/Creatinine Ratio: 15 (ref 9–20)
BUN: 12 mg/dL (ref 6–20)
Bilirubin Total: 0.5 mg/dL (ref 0.0–1.2)
CO2: 19 mmol/L — ABNORMAL LOW (ref 20–29)
Calcium: 9.2 mg/dL (ref 8.7–10.2)
Chloride: 100 mmol/L (ref 96–106)
Creatinine, Ser: 0.8 mg/dL (ref 0.76–1.27)
GFR calc Af Amer: 144 mL/min/{1.73_m2} (ref >59)
GFR calc non Af Amer: 124 mL/min/{1.73_m2} (ref >59)
Globulin, Total: 2.6 g/dL (ref 1.5–4.5)
Glucose: 78 mg/dL (ref 65–99)
Potassium: 4.4 mmol/L (ref 3.5–5.2)
Sodium: 138 mmol/L (ref 134–144)
Total Protein: 7.3 g/dL (ref 6.0–8.5)

## 2018-10-01 LAB — CBC WITH DIFFERENTIAL/PLATELET
Basophils Absolute: 0.1 10*3/uL (ref 0.0–0.2)
Basos: 1 %
EOS (ABSOLUTE): 0.2 10*3/uL (ref 0.0–0.4)
Eos: 2 %
Hematocrit: 44.4 % (ref 37.5–51.0)
Hemoglobin: 14.8 g/dL (ref 13.0–17.7)
Immature Grans (Abs): 0.1 10*3/uL (ref 0.0–0.1)
Immature Granulocytes: 1 %
Lymphocytes Absolute: 2.8 10*3/uL (ref 0.7–3.1)
Lymphs: 32 %
MCH: 26 pg — ABNORMAL LOW (ref 26.6–33.0)
MCHC: 33.3 g/dL (ref 31.5–35.7)
MCV: 78 fL — ABNORMAL LOW (ref 79–97)
Monocytes Absolute: 0.6 10*3/uL (ref 0.1–0.9)
Monocytes: 7 %
Neutrophils Absolute: 5.1 10*3/uL (ref 1.4–7.0)
Neutrophils: 57 %
Platelets: 245 10*3/uL (ref 150–450)
RBC: 5.69 x10E6/uL (ref 4.14–5.80)
RDW: 14.4 % (ref 11.6–15.4)
WBC: 8.8 10*3/uL (ref 3.4–10.8)

## 2018-10-01 LAB — HEMOGLOBIN A1C
Est. average glucose Bld gHb Est-mCnc: 111 mg/dL
Hgb A1c MFr Bld: 5.5 % (ref 4.8–5.6)

## 2018-10-01 LAB — LIPID PANEL
Chol/HDL Ratio: 5.3 ratio — ABNORMAL HIGH (ref 0.0–5.0)
Cholesterol, Total: 207 mg/dL — ABNORMAL HIGH (ref 100–199)
HDL: 39 mg/dL — ABNORMAL LOW (ref >39)
LDL Calculated: 141 mg/dL — ABNORMAL HIGH (ref 0–99)
Triglycerides: 136 mg/dL (ref 0–149)
VLDL Cholesterol Cal: 27 mg/dL (ref 5–40)

## 2018-10-01 LAB — TSH: TSH: 2.64 u[IU]/mL (ref 0.450–4.500)

## 2018-10-02 ENCOUNTER — Telehealth: Payer: Self-pay

## 2018-10-02 NOTE — Telephone Encounter (Signed)
Patient has viewed results on mychart. 10/01/2018 at 4:58 pm

## 2018-10-02 NOTE — Telephone Encounter (Signed)
-----   Message from Trey Sailors, [place] sent at 10/01/2018  4:50 PM EST ----- Cholesterol is elevated compared to last time, wouldn't start treatment. CMET looks normal with normal sugar, kidney, liver function. CBC stable. TSH normal and A1c does not show diabetes.

## 2018-10-08 ENCOUNTER — Ambulatory Visit: Payer: 59 | Admitting: Psychology

## 2018-10-08 DIAGNOSIS — F3289 Other specified depressive episodes: Secondary | ICD-10-CM

## 2018-11-05 ENCOUNTER — Encounter: Payer: Self-pay | Admitting: Psychiatry

## 2018-11-05 ENCOUNTER — Ambulatory Visit: Payer: 59 | Admitting: Psychiatry

## 2018-11-05 ENCOUNTER — Ambulatory Visit: Payer: 59 | Admitting: Psychology

## 2018-11-05 ENCOUNTER — Other Ambulatory Visit: Payer: Self-pay

## 2018-11-05 VITALS — BP 134/84 | HR 80 | Temp 97.8°F | Wt >= 6400 oz

## 2018-11-05 DIAGNOSIS — F411 Generalized anxiety disorder: Secondary | ICD-10-CM

## 2018-11-05 DIAGNOSIS — F5105 Insomnia due to other mental disorder: Secondary | ICD-10-CM

## 2018-11-05 DIAGNOSIS — F840 Autistic disorder: Secondary | ICD-10-CM

## 2018-11-05 DIAGNOSIS — F3289 Other specified depressive episodes: Secondary | ICD-10-CM

## 2018-11-05 DIAGNOSIS — F331 Major depressive disorder, recurrent, moderate: Secondary | ICD-10-CM

## 2018-11-05 MED ORDER — SERTRALINE HCL 100 MG PO TABS: 100.0000 mg | ORAL_TABLET | Freq: Every day | ORAL | 0 refills | Status: DC

## 2018-11-05 MED ORDER — SERTRALINE HCL 25 MG PO TABS: 25.0000 mg | ORAL_TABLET | Freq: Every day | ORAL | 0 refills | Status: DC

## 2018-11-05 NOTE — Progress Notes (Signed)
BH MD OP Progress Note  11/05/2018 3:54 PM Ryan AlimentKyle Ellis  MRN:  [id number]  Chief Complaint: ' I am here for follow up.' Chief Complaint    Follow-up     HPI: Ryan MiyamotoKyle is a 28 year old Caucasian male, single, employed, [place], has a history of depression, anxiety disorder, autism spectrum disorder, OSA on CPAP, hypertension, morbid obesity, presented to clinic today for a follow-up visit.  Patient today reports he continues to do well on the current medication regimen.  He reports he has been compliant with the Zoloft as prescribed.  He however reports he has not taken the Abilify in over a month.  He reports he does not know what happened he did not find it in his pillbox and did not know that he had to take it.  He however reports he has been doing well on just the Zoloft.  He reports sleep is good.  He reports appetite is fair.  Patient reports he has upcoming appointment with bariatric clinic.  He has to take an online class which is scheduled for tomorrow.  He denies any suicidality, homicidality or perceptual disturbances.  Denies any other concerns today. Visit Diagnosis:    ICD-10-CM   1. MDD (major depressive disorder), recurrent episode, moderate (HCC) F33.1 sertraline (ZOLOFT) 25 MG tablet   improving  2. GAD (generalized anxiety disorder) F41.1   3. Autism spectrum disorder F84.0   4. Insomnia due to mental condition F51.05 sertraline (ZOLOFT) 100 MG tablet    Past Psychiatric History: I have reviewed past psychiatric history from my progress note on 05/14/2018.  Past trials of Zoloft, Depakote, Lamictal-rash, lithium-thyroid problems, Abilify, propranolol.  Past Medical History:  Past Medical History:  Diagnosis Date  . Anxiety   . Autism   . Depression   . Hypertension   . Obesity     Past Surgical History:  Procedure Laterality Date  . NO PAST SURGERIES      Family Psychiatric History: Reviewed family psychiatric history from my progress note on  05/14/2018  Family History:  Family History  Problem Relation Age of Onset  . Healthy Mother   . Alcohol abuse Mother   . Anxiety disorder Mother   . Depression Mother   . Seizures Father   . Hypertension Father   . Anxiety disorder Father   . Depression Father   . Heart attack Maternal Grandmother   . Diabetes Maternal Grandmother   . Heart attack Maternal Grandfather   . Anxiety disorder Sister   . Depression Sister     Social History: Reviewed social history from my progress note on 05/14/2018 Social History   Socioeconomic History  . Marital status: Single    Spouse name: Not on file  . Number of children: 0  . Years of education: Not on file  . Highest education level: Associate degree: occupational, Scientist, product/process developmenttechnical, or vocational program  Occupational History  . Not on file  Social Needs  . Financial resource strain: Not hard at all  . Food insecurity:    Worry: Never true    Inability: Never true  . Transportation needs:    Medical: No    Non-medical: No  Tobacco Use  . Smoking status: Never Smoker  . Smokeless tobacco: Never Used  Substance and Sexual Activity  . Alcohol use: Not Currently    Alcohol/week: 1.0 standard drinks    Types: 1 Cans of beer per week  . Drug use: Never  . Sexual activity: Not Currently  Lifestyle  .  Physical activity:    Days per week: 0 days    Minutes per session: 0 min  . Stress: Not at all  Relationships  . Social connections:    Talks on phone: Once a week    Gets together: Once a week    Attends religious service: Never    Active member of club or organization: Yes    Attends meetings of clubs or organizations: More than 4 times per year    Relationship status: Never married  Other Topics Concern  . Not on file  Social History Narrative  . Not on file    Allergies:  Allergies  Allergen Reactions  . Amoxicillin Hives  . Cat Hair Extract     Other reaction(s): Unknown  . Cephalexin Hives  . Lamotrigine     Other  reaction(s): Unknown SEVERE RASH THAT WAS LIKE SJ SYNDROME  . Lithium     Other reaction(s): Unknown Thyroid issues - increased?    Metabolic Disorder Labs: Lab Results  Component Value Date   HGBA1C 5.5 09/30/2018   No results found for: PROLACTIN Lab Results  Component Value Date   CHOL 207 (H) 09/30/2018   TRIG 136 09/30/2018   HDL 39 (L) 09/30/2018   CHOLHDL 5.3 (H) 09/30/2018   LDLCALC 141 (H) 09/30/2018   LDLCALC 118 (H) 03/06/2018   Lab Results  Component Value Date   TSH 2.640 09/30/2018   TSH 1.670 03/06/2018    Therapeutic Level Labs: No results found for: LITHIUM No results found for: VALPROATE No components found for:  CBMZ  Current Medications: Current Outpatient Medications  Medication Sig Dispense Refill  . amLODipine (NORVASC) 10 MG tablet Take 1 tablet (10 mg total) by mouth daily. 90 tablet 3  . lisinopril-hydrochlorothiazide (PRINZIDE,ZESTORETIC) 20-25 MG tablet Take 1 tablet by mouth daily. 90 tablet 3  . propranolol (INDERAL) 10 MG tablet Take 1 tablet (10 mg total) by mouth 2 (two) times daily as needed. Only for severe anxiety attacks 60 tablet 1  . sertraline (ZOLOFT) 100 MG tablet Take 1 tablet (100 mg total) by mouth daily. To be combined with 25 mg 90 tablet 0  . sertraline (ZOLOFT) 25 MG tablet Take 1 tablet (25 mg total) by mouth daily. Combine with 100 mg 90 tablet 0   No current facility-administered medications for this visit.      Musculoskeletal: Strength & Muscle Tone: within normal limits Gait & Station: normal Patient leans: N/A  Psychiatric Specialty Exam: Review of Systems  Psychiatric/Behavioral: Negative for depression. The patient is not nervous/anxious.   All other systems reviewed and are negative.   Blood pressure 134/84, pulse 80, temperature 97.8 F (36.6 C), temperature source Oral, weight (!) 476 lb 6.4 oz (216.1 kg).Body mass index is 64.61 kg/m.  General Appearance: Casual  Eye Contact:  Fair  Speech:   Clear and Coherent  Volume:  Normal  Mood:  Euthymic  Affect:  Congruent  Thought Process:  Goal Directed and Descriptions of Associations: Intact  Orientation:  Full (Time, Place, and Person)  Thought Content: Logical   Suicidal Thoughts:  No  Homicidal Thoughts:  No  Memory:  Immediate;   Fair Recent;   Fair Remote;   Fair  Judgement:  Fair  Insight:  Fair  Psychomotor Activity:  Normal  Concentration:  Concentration: Fair and Attention Span: Fair  Recall:  [employer] of Knowledge: Fair  Language: Fair  Akathisia:  No  Handed:  Right  AIMS (if indicated): denies  tremors, rigidity,stiffness  Assets:  Communication Skills Desire for Improvement Social Support  ADL's:  Intact  Cognition: WNL  Sleep:  Fair   Screenings: PHQ2-9     Office Visit from 09/30/2018 in [facility] Office Visit from 03/05/2018 in [facility]  PHQ-2 Total Score  1  4  PHQ-9 Total Score  2  17       Assessment and Plan: Mohab is a 28 year old Caucasian male, single, lives in [place], employed, has a history of depression, anxiety, autism spectrum disorder, OSA on CPAP, hypertension, morbid obesity, presented to clinic today for a follow-up visit.  Patient is biologically predisposed given his multiple medical problems, history of autism spectrum disorder, family history of mental health problems.  He also has psychosocial stressors of relationship struggles.  Patient today reports making progress on the current medication regimen.  Will continue plan as noted below.  Plan For MDD- stable Zoloft 125 mg p.o. daily Discontinue Abilify for noncompliance Continue psychotherapy with therapist Mr. Hulan Amato.  GAD-stable Zoloft 125 mg p.o. daily  For insomnia-stable Patient is compliant with his CPAP for OSA  For panic symptoms-stable Propranolol 10 mg p.o. twice daily as needed Continue CBT  Follow-up in clinic in 3 months or sooner if needed.  I have spent atleast 15  minutes face to face with patient today. More than 50 % of the time was spent for psychoeducation and supportive psychotherapy and care coordination.  This note was generated in part or whole with voice recognition software. Voice recognition is usually quite accurate but there are transcription errors that can and very often do occur. I apologize for any typographical errors that were not detected and corrected.        Jomarie Longs, MD 11/05/2018, 3:54 PM

## 2018-11-21 ENCOUNTER — Telehealth: Payer: Self-pay | Admitting: Physician Assistant

## 2018-11-21 NOTE — Telephone Encounter (Signed)
Pt needing Adriana to call him regarding the procedure they discussed for him. Needing to write a letter to employer.  Please call pt back and discuss.  Thanks, [employer]

## 2018-11-21 NOTE — Telephone Encounter (Signed)
Pt returned call  The procedure is Bariatric surgery (any type)  teri

## 2018-11-21 NOTE — Telephone Encounter (Signed)
LMTCB

## 2018-11-21 NOTE — Telephone Encounter (Signed)
I'm assuming he means the bariatric surgery? What does he need the note to say.

## 2018-11-21 NOTE — Telephone Encounter (Signed)
Patient reports if the note can say how important the surgery is for him and his health.

## 2018-11-21 NOTE — Telephone Encounter (Signed)
Note provided

## 2018-12-10 ENCOUNTER — Ambulatory Visit (INDEPENDENT_AMBULATORY_CARE_PROVIDER_SITE_OTHER): Payer: 59 | Admitting: Psychology

## 2018-12-10 DIAGNOSIS — F3289 Other specified depressive episodes: Secondary | ICD-10-CM | POA: Diagnosis not present

## 2019-01-14 ENCOUNTER — Ambulatory Visit (INDEPENDENT_AMBULATORY_CARE_PROVIDER_SITE_OTHER): Payer: 59 | Admitting: Psychology

## 2019-01-14 DIAGNOSIS — F3289 Other specified depressive episodes: Secondary | ICD-10-CM

## 2019-02-05 ENCOUNTER — Ambulatory Visit (INDEPENDENT_AMBULATORY_CARE_PROVIDER_SITE_OTHER): Payer: 59 | Admitting: Psychiatry

## 2019-02-05 ENCOUNTER — Encounter: Payer: Self-pay | Admitting: Psychiatry

## 2019-02-05 ENCOUNTER — Other Ambulatory Visit: Payer: Self-pay

## 2019-02-05 DIAGNOSIS — F5105 Insomnia due to other mental disorder: Secondary | ICD-10-CM

## 2019-02-05 DIAGNOSIS — F840 Autistic disorder: Secondary | ICD-10-CM

## 2019-02-05 DIAGNOSIS — F331 Major depressive disorder, recurrent, moderate: Secondary | ICD-10-CM

## 2019-02-05 DIAGNOSIS — F411 Generalized anxiety disorder: Secondary | ICD-10-CM | POA: Diagnosis not present

## 2019-02-05 MED ORDER — SERTRALINE HCL 100 MG PO TABS: 100.0000 mg | ORAL_TABLET | Freq: Every day | ORAL | 0 refills | Status: DC

## 2019-02-05 MED ORDER — SERTRALINE HCL 25 MG PO TABS: 25.0000 mg | ORAL_TABLET | Freq: Every day | ORAL | 0 refills | Status: DC

## 2019-02-05 NOTE — Progress Notes (Signed)
Virtual Visit via Video Note  I connected with Ryan Ellis on 02/05/19 at  1:15 PM EDT by a video enabled telemedicine application and verified that I am speaking with the correct person using two identifiers.   I discussed the limitations of evaluation and management by telemedicine and the availability of in person appointments. The patient expressed understanding and agreed to proceed.   I discussed the assessment and treatment plan with the patient. The patient was provided an opportunity to ask questions and all were answered. The patient agreed with the plan and demonstrated an understanding of the instructions.   The patient was advised to call back or seek an in-person evaluation if the symptoms worsen or if the condition fails to improve as anticipated.   BH MD OP Progress Note  02/05/2019 5:39 PM Ryan Ellis  MRN:  [id number]  Chief Complaint:  Chief Complaint    Follow-up     HPI: Ryan Ellis is a 28 year old Caucasian male, single, employed, lives in [place], has a history of MDD, GAD, autism spectrum disorder, insomnia, OSA on CPAP, hypertension, morbid obesity was evaluated by telemedicine today.  Patient today reports he is currently working from home due to the pandemic.  He reports work is going well.  He has been trying to play video games as well as chat with friends to spend time otherwise.  So far he has been coping okay with the pandemic although he is mildly anxious on and off.  He reports he is compliant on his medications as prescribed.  He denies any significant sadness, lack of motivation or other symptoms.  He reports sleep is good.  He denies any suicidality, homicidality or perceptual disturbances.  He is compliant on Zoloft.  He also reports he restarted taking Abilify even though he had stopped it months ago.  He did it by mistake.  He reports he will try to wean it off again and see if he tolerates it okay.  Otherwise he will give Clinical research associatewriter a call back.   Visit  Diagnosis:    ICD-10-CM   1. MDD (major depressive disorder), recurrent episode, moderate (HCC) F33.1    IMPROVING  2. GAD (generalized anxiety disorder) F41.1   3. Autism spectrum disorder F84.0   4. Insomnia due to mental condition F51.05 sertraline (ZOLOFT) 100 MG tablet    Past Psychiatric History: Reviewed past psychiatric history from my progress note on 05/14/2018.  Past trials of Zoloft, Depakote, Lamictal-rash, lithium-thyroid problems, Abilify, propranolol.  Past Medical History:  Past Medical History:  Diagnosis Date  . Anxiety   . Autism   . Depression   . Hypertension   . Obesity     Past Surgical History:  Procedure Laterality Date  . NO PAST SURGERIES      Family Psychiatric History: I have reviewed family psychiatric history from my progress note on 05/14/2018.  Family History:  Family History  Problem Relation Age of Onset  . Healthy Mother   . Alcohol abuse Mother   . Anxiety disorder Mother   . Depression Mother   . Seizures Father   . Hypertension Father   . Anxiety disorder Father   . Depression Father   . Heart attack Maternal Grandmother   . Diabetes Maternal Grandmother   . Heart attack Maternal Grandfather   . Anxiety disorder Sister   . Depression Sister     Social History: Reviewed social history from my progress note on 05/14/2018 Social History   Socioeconomic History  . Marital  status: Single    Spouse name: Not on file  . Number of children: 0  . Years of education: Not on file  . Highest education level: Associate degree: occupational, Scientist, product/process development, or vocational program  Occupational History  . Not on file  Social Needs  . Financial resource strain: Not hard at all  . Food insecurity:    Worry: Never true    Inability: Never true  . Transportation needs:    Medical: No    Non-medical: No  Tobacco Use  . Smoking status: Never Smoker  . Smokeless tobacco: Never Used  Substance and Sexual Activity  . Alcohol use: Not  Currently    Alcohol/week: 1.0 standard drinks    Types: 1 Cans of beer per week  . Drug use: Never  . Sexual activity: Not Currently  Lifestyle  . Physical activity:    Days per week: 0 days    Minutes per session: 0 min  . Stress: Not at all  Relationships  . Social connections:    Talks on phone: Once a week    Gets together: Once a week    Attends religious service: Never    Active member of club or organization: Yes    Attends meetings of clubs or organizations: More than 4 times per year    Relationship status: Never married  Other Topics Concern  . Not on file  Social History Narrative  . Not on file    Allergies:  Allergies  Allergen Reactions  . Amoxicillin Hives  . Cat Hair Extract     Other reaction(s): Unknown  . Cephalexin Hives  . Lamotrigine     Other reaction(s): Unknown SEVERE RASH THAT WAS LIKE SJ SYNDROME  . Lithium     Other reaction(s): Unknown Thyroid issues - increased?    Metabolic Disorder Labs: Lab Results  Component Value Date   HGBA1C 5.5 09/30/2018   No results found for: PROLACTIN Lab Results  Component Value Date   CHOL 207 (H) 09/30/2018   TRIG 136 09/30/2018   HDL 39 (L) 09/30/2018   CHOLHDL 5.3 (H) 09/30/2018   LDLCALC 141 (H) 09/30/2018   LDLCALC 118 (H) 03/06/2018   Lab Results  Component Value Date   TSH 2.640 09/30/2018   TSH 1.670 03/06/2018    Therapeutic Level Labs: No results found for: LITHIUM No results found for: VALPROATE No components found for:  CBMZ  Current Medications: Current Outpatient Medications  Medication Sig Dispense Refill  . amLODipine (NORVASC) 10 MG tablet Take 1 tablet (10 mg total) by mouth daily. 90 tablet 3  . lisinopril-hydrochlorothiazide (PRINZIDE,ZESTORETIC) 20-25 MG tablet Take 1 tablet by mouth daily. 90 tablet 3  . propranolol (INDERAL) 10 MG tablet Take 1 tablet (10 mg total) by mouth 2 (two) times daily as needed. Only for severe anxiety attacks 60 tablet 1  . sertraline  (ZOLOFT) 100 MG tablet Take 1 tablet (100 mg total) by mouth daily. To be combined with 25 mg 90 tablet 0  . sertraline (ZOLOFT) 25 MG tablet Take 1 tablet (25 mg total) by mouth daily. Combine with 100 mg 90 tablet 0   No current facility-administered medications for this visit.      Musculoskeletal: Strength & Muscle Tone: within normal limits Gait & Station: normal Patient leans: N/A  Psychiatric Specialty Exam: Review of Systems  Psychiatric/Behavioral: The patient is nervous/anxious.   All other systems reviewed and are negative.   There were no vitals taken for this visit.There is  no height or weight on file to calculate BMI.  General Appearance: Casual  Eye Contact:  Fair  Speech:  Clear and Coherent  Volume:  Normal  Mood:  Anxious  Affect:  Congruent  Thought Process:  Goal Directed and Descriptions of Associations: Intact  Orientation:  Full (Time, Place, and Person)  Thought Content: Logical   Suicidal Thoughts:  No  Homicidal Thoughts:  No  Memory:  Immediate;   Fair Recent;   Fair Remote;   Fair  Judgement:  Fair  Insight:  Fair  Psychomotor Activity:  Normal  Concentration:  Concentration: Fair and Attention Span: Fair  Recall:  [employer] of Knowledge: Fair  Language: Fair  Akathisia:  No  Handed:  Right  AIMS (if indicated): denies tremors, rigidity  Assets:  Communication Skills Desire for Improvement Housing Social Support Talents/Skills Transportation  ADL's:  Intact  Cognition: WNL  Sleep:  Fair   Screenings: PHQ2-9     Office Visit from 09/30/2018 in [facility] Office Visit from 03/05/2018 in [facility]  PHQ-2 Total Score  1  4  PHQ-9 Total Score  2  17       Assessment and Plan: Ryan Ellis is a 28 year old Caucasian male, single, lives in [place], employed, has a history of depression, anxiety, autism spectrum disorder, OSA on CPAP, hypertension, morbid obesity, was evaluated by telemedicine today.  Patient  is biologically predisposed given his multiple health problems, autism spectrum disorder and family history of mental health problems.  Patient also has psychosocial stressors of relationship struggles.  Patient will continue to benefit from medications.  Plan as noted below.  Plan For MDD-in remission Zoloft 125 mg p.o. daily Taper off Abilify. Continue psychotherapy with his therapist Mr.Mostafa.  GAD-stable Zoloft 125 mg p.o. daily  For insomnia-stable Patient is compliant with CPAP for his OSA  Panic attacks-stable Zoloft as prescribed Continue CBT  Follow-up in clinic in 2 months or sooner if needed.  August 6 at 4:30 PM  I have spent atleast 15 minutes non face to face with patient today. More than 50 % of the time was spent for psychoeducation and supportive psychotherapy and care coordination.  This note was generated in part or whole with voice recognition software. Voice recognition is usually quite accurate but there are transcription errors that can and very often do occur. I apologize for any typographical errors that were not detected and corrected.          Jomarie Longs, MD 02/05/2019, 5:39 PM

## 2019-02-11 ENCOUNTER — Ambulatory Visit (INDEPENDENT_AMBULATORY_CARE_PROVIDER_SITE_OTHER): Payer: 59 | Admitting: Psychology

## 2019-02-11 DIAGNOSIS — F3289 Other specified depressive episodes: Secondary | ICD-10-CM | POA: Diagnosis not present

## 2019-03-06 ENCOUNTER — Ambulatory Visit: Payer: Self-pay | Admitting: Physician Assistant

## 2019-03-11 ENCOUNTER — Ambulatory Visit (INDEPENDENT_AMBULATORY_CARE_PROVIDER_SITE_OTHER): Payer: 59 | Admitting: Psychology

## 2019-03-11 DIAGNOSIS — F3289 Other specified depressive episodes: Secondary | ICD-10-CM | POA: Diagnosis not present

## 2019-03-27 ENCOUNTER — Telehealth: Payer: Self-pay | Admitting: Physician Assistant

## 2019-04-08 ENCOUNTER — Ambulatory Visit (INDEPENDENT_AMBULATORY_CARE_PROVIDER_SITE_OTHER): Payer: 59 | Admitting: Psychology

## 2019-04-08 DIAGNOSIS — F3289 Other specified depressive episodes: Secondary | ICD-10-CM | POA: Diagnosis not present

## 2019-04-09 ENCOUNTER — Encounter: Payer: Self-pay | Admitting: Physician Assistant

## 2019-04-09 ENCOUNTER — Other Ambulatory Visit: Payer: Self-pay

## 2019-04-09 ENCOUNTER — Ambulatory Visit: Payer: 59 | Admitting: Physician Assistant

## 2019-04-09 DIAGNOSIS — I10 Essential (primary) hypertension: Secondary | ICD-10-CM | POA: Diagnosis not present

## 2019-04-09 MED ORDER — AMLODIPINE BESYLATE 10 MG PO TABS: 10.0000 mg | ORAL_TABLET | Freq: Every day | ORAL | 3 refills | Status: DC

## 2019-04-09 MED ORDER — LISINOPRIL-HYDROCHLOROTHIAZIDE 20-25 MG PO TABS: 1.0000 | ORAL_TABLET | Freq: Every day | ORAL | 3 refills | Status: DC

## 2019-04-09 NOTE — Progress Notes (Signed)
Patient: Ryan Ellis Male    DOB: 09/26/1990   28 y.o.   MRN: [id number] Visit Date: 04/09/2019  Today's Provider: Trinna Post, PA-C   Chief Complaint  Patient presents with  . Hypertension   Subjective:     HPI  Hypertension, follow-up:  BP Readings from Last 3 Encounters:  04/09/19 135/82  09/30/18 137/84  06/10/18 (!) 130/94    He was last seen for hypertension 6 months ago.  BP at that visit was 137/84. Management changes since that visit include no changes. He reports excellent compliance with treatment. He is not having side effects.  He is not exercising. He is not adherent to low salt diet.   Outside blood pressures are not being checked. He is experiencing none.  Patient denies chest pain and lower extremity edema.   Cardiovascular risk factors include hypertension and obesity (BMI >= 30 kg/m2).  Use of agents associated with hypertension: none.     Weight trend: stable Wt Readings from Last 3 Encounters:  04/09/19 (!) 466 lb (211.4 kg)  09/30/18 (!) 470 lb (213.2 kg)  06/10/18 (!) 461 lb (209.1 kg)    Current diet: He is trying keto.   Obesity: His insurance does not pay for bariatric surgery.  ------------------------------------------------------------------------   Allergies  Allergen Reactions  . Amoxicillin Hives  . Cat Hair Extract     Other reaction(s): Unknown  . Cephalexin Hives  . Lamotrigine     Other reaction(s): Unknown SEVERE RASH THAT WAS LIKE SJ SYNDROME  . Lithium     Other reaction(s): Unknown Thyroid issues - increased?     Current Outpatient Medications:  .  amLODipine (NORVASC) 10 MG tablet, Take 1 tablet (10 mg total) by mouth daily., Disp: 90 tablet, Rfl: 3 .  lisinopril-hydrochlorothiazide (PRINZIDE,ZESTORETIC) 20-25 MG tablet, Take 1 tablet by mouth daily., Disp: 90 tablet, Rfl: 3 .  propranolol (INDERAL) 10 MG tablet, Take 1 tablet (10 mg total) by mouth 2 (two) times daily as needed. Only for severe  anxiety attacks, Disp: 60 tablet, Rfl: 1 .  sertraline (ZOLOFT) 100 MG tablet, Take 1 tablet (100 mg total) by mouth daily. To be combined with 25 mg, Disp: 90 tablet, Rfl: 0 .  sertraline (ZOLOFT) 25 MG tablet, Take 1 tablet (25 mg total) by mouth daily. Combine with 100 mg, Disp: 90 tablet, Rfl: 0  Review of Systems  Constitutional: Negative.   Respiratory: Negative.   Cardiovascular: Negative.     Social History   Tobacco Use  . Smoking status: Never Smoker  . Smokeless tobacco: Never Used  Substance Use Topics  . Alcohol use: Not Currently    Alcohol/week: 1.0 standard drinks    Types: 1 Cans of beer per week      Objective:   BP 135/82   Pulse 92   Temp (!) 97.5 F (36.4 C) (Oral)   Resp 16   Wt (!) 466 lb (211.4 kg)   BMI 63.20 kg/m  Vitals:   04/09/19 1507 04/09/19 1643  BP: (!) 161/90 135/82  Pulse: 92   Resp: 16   Temp: (!) 97.5 F (36.4 C)   TempSrc: Oral   Weight: (!) 466 lb (211.4 kg)      Physical Exam Constitutional:      Appearance: Normal appearance. He is obese.  Cardiovascular:     Rate and Rhythm: Normal rate and regular rhythm.     Heart sounds: Normal heart sounds.  Pulmonary:  Effort: Pulmonary effort is normal.     Breath sounds: Normal breath sounds.  Skin:    General: Skin is warm and dry.  Neurological:     Mental Status: He is alert and oriented to person, place, and time. Mental status is at baseline.  Psychiatric:        Mood and Affect: Mood normal.        Behavior: Behavior normal.      No results found for any visits on 04/09/19.     Assessment & Plan    1. Essential hypertension  Continue medication as below. F/u in 6 months, will get labs then.   - amLODipine (NORVASC) 10 MG tablet; Take 1 tablet (10 mg total) by mouth daily.  Dispense: 90 tablet; Refill: 3 - lisinopril-hydrochlorothiazide (ZESTORETIC) 20-25 MG tablet; Take 1 tablet by mouth daily.  Dispense: 90 tablet; Refill: 3  2. Morbid obesity (HCC)   Insurance is not paying for bariatric surgery. He is trying keto right now.   The entirety of the information documented in the History of Present Illness, Review of Systems and Physical Exam were personally obtained by me. Portions of this information were initially documented by Rondel BatonSulibeya Dimas, CMA and reviewed by me for thoroughness and accuracy.      Trey SailorsAdriana M Pollak, PA-C  [facility]

## 2019-04-09 NOTE — Patient Instructions (Signed)

## 2019-04-10 ENCOUNTER — Ambulatory Visit (INDEPENDENT_AMBULATORY_CARE_PROVIDER_SITE_OTHER): Payer: 59 | Admitting: Psychiatry

## 2019-04-10 ENCOUNTER — Encounter: Payer: Self-pay | Admitting: Psychiatry

## 2019-04-10 DIAGNOSIS — F3341 Major depressive disorder, recurrent, in partial remission: Secondary | ICD-10-CM | POA: Diagnosis not present

## 2019-04-10 DIAGNOSIS — F411 Generalized anxiety disorder: Secondary | ICD-10-CM | POA: Diagnosis not present

## 2019-04-10 DIAGNOSIS — F5105 Insomnia due to other mental disorder: Secondary | ICD-10-CM | POA: Diagnosis not present

## 2019-04-10 DIAGNOSIS — F840 Autistic disorder: Secondary | ICD-10-CM

## 2019-04-10 DIAGNOSIS — F331 Major depressive disorder, recurrent, moderate: Secondary | ICD-10-CM | POA: Insufficient documentation

## 2019-04-10 MED ORDER — SERTRALINE HCL 25 MG PO TABS: 25.0000 mg | ORAL_TABLET | Freq: Every day | ORAL | 0 refills | Status: DC

## 2019-04-10 MED ORDER — SERTRALINE HCL 100 MG PO TABS: 100.0000 mg | ORAL_TABLET | Freq: Every day | ORAL | 0 refills | Status: DC

## 2019-04-10 MED ORDER — PROPRANOLOL HCL 10 MG PO TABS: 10.0000 mg | ORAL_TABLET | Freq: Two times a day (BID) | ORAL | 1 refills | Status: AC | PRN

## 2019-04-10 NOTE — Progress Notes (Signed)
Virtual Visit via Video Note  I connected with Brion AlimentKyle Patil on 04/10/19 at  4:30 PM EDT by a video enabled telemedicine application and verified that I am speaking with the correct person using two identifiers.   I discussed the limitations of evaluation and management by telemedicine and the availability of in person appointments. The patient expressed understanding and agreed to proceed.   I discussed the assessment and treatment plan with the patient. The patient was provided an opportunity to ask questions and all were answered. The patient agreed with the plan and demonstrated an understanding of the instructions.   The patient was advised to call back or seek an in-person evaluation if the symptoms worsen or if the condition fails to improve as anticipated.   BH MD  OP Progress Note  04/10/2019 4:34 PM Brion AlimentKyle Tewell  MRN:  [id number]  Chief Complaint:  Chief Complaint    Follow-up     HPI: Ryan Ellis is a 28 year old Caucasian male, single, employed, lives in [place], has a history of MDD, GAD, autism spectrum disorder, insomnia, OSA on CPAP, hypertension, morbid obesity was evaluated by telemedicine today.  Patient today reports he is currently doing okay with regards to his mood.  He is completely off of the Abilify.  He is tolerating that well.  He continues to be compliant with Zoloft.  Patient reports work is going well.  He continues to work remotely.  He reports he does struggle with some excessive sleep.  He reports he takes a nap after work from 5 PM to 6 PM.  He usually goes to bed at around midnight and wakes up at around 10 AM in the morning.  Patient denies any suicidality, homicidality or perceptual disturbances.  He is currently taking a diet in order to lose weight.  He reports he has been walking and also has been talking to his friends on social media.  He denies any other concerns today.   Visit Diagnosis:    ICD-10-CM   1. MDD (major depressive disorder),  recurrent, in partial remission (HCC)  F33.41 sertraline (ZOLOFT) 25 MG tablet    propranolol (INDERAL) 10 MG tablet  2. GAD (generalized anxiety disorder)  F41.1 sertraline (ZOLOFT) 25 MG tablet    propranolol (INDERAL) 10 MG tablet   improving  3. Autism spectrum disorder  F84.0   4. Insomnia due to mental condition  F51.05 sertraline (ZOLOFT) 100 MG tablet    Past Psychiatric History: I have reviewed past psychiatric history from my progress note on 05/14/2018.  Past trials of Zoloft, Depakote, Lamictal - rash-lithium-thyroid problems, Abilify, propranolol.  Past Medical History:  Past Medical History:  Diagnosis Date  . Anxiety   . Autism   . Depression   . Hypertension   . Obesity     Past Surgical History:  Procedure Laterality Date  . NO PAST SURGERIES      Family Psychiatric History: I have reviewed family psychiatric history from my progress note on 05/14/2018.  Family History:  Family History  Problem Relation Age of Onset  . Healthy Mother   . Alcohol abuse Mother   . Anxiety disorder Mother   . Depression Mother   . Seizures Father   . Hypertension Father   . Anxiety disorder Father   . Depression Father   . Heart attack Maternal Grandmother   . Diabetes Maternal Grandmother   . Heart attack Maternal Grandfather   . Anxiety disorder Sister   . Depression Sister  Social History: I have reviewed social history from my progress note on 05/14/2018. Social History   Socioeconomic History  . Marital status: Single    Spouse name: Not on file  . Number of children: 0  . Years of education: Not on file  . Highest education level: Associate degree: occupational, Scientist, product/process developmenttechnical, or vocational program  Occupational History  . Not on file  Social Needs  . Financial resource strain: Not hard at all  . Food insecurity    Worry: Never true    Inability: Never true  . Transportation needs    Medical: No    Non-medical: No  Tobacco Use  . Smoking status: Never  Smoker  . Smokeless tobacco: Never Used  Substance and Sexual Activity  . Alcohol use: Not Currently    Alcohol/week: 1.0 standard drinks    Types: 1 Cans of beer per week  . Drug use: Never  . Sexual activity: Not Currently  Lifestyle  . Physical activity    Days per week: 0 days    Minutes per session: 0 min  . Stress: Not at all  Relationships  . Social Musicianconnections    Talks on phone: Once a week    Gets together: Once a week    Attends religious service: Never    Active member of club or organization: Yes    Attends meetings of clubs or organizations: More than 4 times per year    Relationship status: Never married  Other Topics Concern  . Not on file  Social History Narrative  . Not on file    Allergies:  Allergies  Allergen Reactions  . Amoxicillin Hives  . Cat Hair Extract     Other reaction(s): Unknown  . Cephalexin Hives  . Lamotrigine     Other reaction(s): Unknown SEVERE RASH THAT WAS LIKE SJ SYNDROME  . Lithium     Other reaction(s): Unknown Thyroid issues - increased?    Metabolic Disorder Labs: Lab Results  Component Value Date   HGBA1C 5.5 09/30/2018   No results found for: PROLACTIN Lab Results  Component Value Date   CHOL 207 (H) 09/30/2018   TRIG 136 09/30/2018   HDL 39 (L) 09/30/2018   CHOLHDL 5.3 (H) 09/30/2018   LDLCALC 141 (H) 09/30/2018   LDLCALC 118 (H) 03/06/2018   Lab Results  Component Value Date   TSH 2.640 09/30/2018   TSH 1.670 03/06/2018    Therapeutic Level Labs: No results found for: LITHIUM No results found for: VALPROATE No components found for:  CBMZ  Current Medications: Current Outpatient Medications  Medication Sig Dispense Refill  . amLODipine (NORVASC) 10 MG tablet Take 1 tablet (10 mg total) by mouth daily. 90 tablet 3  . lisinopril-hydrochlorothiazide (ZESTORETIC) 20-25 MG tablet Take 1 tablet by mouth daily. 90 tablet 3  . propranolol (INDERAL) 10 MG tablet Take 1 tablet (10 mg total) by mouth 2 (two)  times daily as needed. Only for severe anxiety attacks 60 tablet 1  . sertraline (ZOLOFT) 100 MG tablet Take 1 tablet (100 mg total) by mouth daily. To be combined with 25 mg 90 tablet 0  . sertraline (ZOLOFT) 25 MG tablet Take 1 tablet (25 mg total) by mouth daily. Combine with 100 mg 90 tablet 0   No current facility-administered medications for this visit.      Musculoskeletal: Strength & Muscle Tone: UTA Gait & Station: normal Patient leans: N/A  Psychiatric Specialty Exam: Review of Systems  Psychiatric/Behavioral: Negative for depression. The  patient is not nervous/anxious.   All other systems reviewed and are negative.   There were no vitals taken for this visit.There is no height or weight on file to calculate BMI.  General Appearance: Casual  Eye Contact:  Fair  Speech:  Clear and Coherent  Volume:  Normal  Mood:  Euthymic  Affect:  Congruent  Thought Process:  Goal Directed and Descriptions of Associations: Intact  Orientation:  Full (Time, Place, and Person)  Thought Content: Logical   Suicidal Thoughts:  No  Homicidal Thoughts:  No  Memory:  Immediate;   Fair Recent;   Fair Remote;   Fair  Judgement:  Fair  Insight:  Fair  Psychomotor Activity:  Normal  Concentration:  Concentration: Fair and Attention Span: Fair  Recall:  [employer] of Knowledge: Fair  Language: Fair  Akathisia:  No  Handed:  Right  AIMS (if indicated): denies tremors  Assets:  Communication Skills Desire for Improvement Social Support  ADL's:  Intact  Cognition: WNL  Sleep:  Excessive at times   Screenings: PHQ2-9     Office Visit from 04/10/2019 in [place] Visit from 09/30/2018 in [place] Visit from 03/05/2018 in [facility]  PHQ-2 Total Score  2  1  4   PHQ-9 Total Score  5  2  17        Assessment and Plan: Lebert is a 28 year old Caucasian male, single, lives in [place], employed, has a history of  depression, anxiety, autism spectrum disorder, OSA on CPAP, hypertension, morbid obesity was evaluated by telemedicine today.  Patient is biologically predisposed given his multiple health problems, autism spectrum disorder and family history of mental health problems.  Patient currently is doing well on the current medication regimen.  Plan as noted below.  Plan For MDD-in remission Zoloft 125 mg p.o. daily Continue psychotherapy with his therapist Mr.Mostafa PHQ-9 equals 5 today  For GAD-stable Zoloft 125 mg p.o. daily  For insomnia-stable Patient is compliant with CPAP for OSA Patient does report possible excessive sleep-discussed sleep hygiene techniques with patient.  He will work on the same.  For panic attacks-stable Zoloft as prescribed Continue CBT   Follow-up in clinic in 3 months or sooner if needed.  November 4 at 4 PM  I have spent atleast 15 minutes non  face to face with patient today. More than 50 % of the time was spent for psychoeducation and supportive psychotherapy and care coordination.  This note was generated in part or whole with voice recognition software. Voice recognition is usually quite accurate but there are transcription errors that can and very often do occur. I apologize for any typographical errors that were not detected and corrected.         Ursula Alert, MD 04/10/2019, 4:34 PM

## 2019-07-09 ENCOUNTER — Other Ambulatory Visit: Payer: Self-pay

## 2019-07-09 ENCOUNTER — Ambulatory Visit (INDEPENDENT_AMBULATORY_CARE_PROVIDER_SITE_OTHER): Payer: 59 | Admitting: Psychiatry

## 2019-07-09 ENCOUNTER — Encounter: Payer: Self-pay | Admitting: Psychiatry

## 2019-07-09 DIAGNOSIS — F5105 Insomnia due to other mental disorder: Secondary | ICD-10-CM | POA: Diagnosis not present

## 2019-07-09 DIAGNOSIS — F411 Generalized anxiety disorder: Secondary | ICD-10-CM

## 2019-07-09 DIAGNOSIS — F3341 Major depressive disorder, recurrent, in partial remission: Secondary | ICD-10-CM | POA: Insufficient documentation

## 2019-07-09 DIAGNOSIS — F3342 Major depressive disorder, recurrent, in full remission: Secondary | ICD-10-CM | POA: Diagnosis not present

## 2019-07-09 DIAGNOSIS — F840 Autistic disorder: Secondary | ICD-10-CM

## 2019-07-09 MED ORDER — SERTRALINE HCL 25 MG PO TABS: 25.0000 mg | ORAL_TABLET | Freq: Every day | ORAL | 0 refills | Status: DC

## 2019-07-09 MED ORDER — SERTRALINE HCL 100 MG PO TABS: 100.0000 mg | ORAL_TABLET | Freq: Every day | ORAL | 0 refills | Status: DC

## 2019-07-09 NOTE — Progress Notes (Signed)
Virtual Visit via Video Note  I connected with Ryan Ellis on 07/09/19 at  4:00 PM EST by a video enabled telemedicine application and verified that I am speaking with the correct person using two identifiers.   I discussed the limitations of evaluation and management by telemedicine and the availability of in person appointments. The patient expressed understanding and agreed to proceed.    I discussed the assessment and treatment plan with the patient. The patient was provided an opportunity to ask questions and all were answered. The patient agreed with the plan and demonstrated an understanding of the instructions.   The patient was advised to call back or seek an in-person evaluation if the symptoms worsen or if the condition fails to improve as anticipated.   BH MD OP Progress Note  07/09/2019 4:13 PM [name]  MRN:  [id number]  Chief Complaint:  Chief Complaint    Follow-up     HPI: Ryan Ellis is a 28 year old Caucasian male, single, employed, lives in [place], has a history of MDD, GAD, autism spectrum disorder, insomnia, OSA on CPAP, hypertension, morbid obesity was evaluated by telemedicine today.  Patient today reports he is currently doing well with regards to his mood symptoms.  He denies any sadness or anxiety symptoms.  He is compliant on his medications as prescribed.  Patient reports sleep is good.  He is compliant on CPAP.  Patient reports he does not use the propranolol as needed a lot since he is not that anxious.  He has been limiting use.  He is trying to exercise and watching his diet.  Patient denies any suicidality, homicidality or perceptual disturbances.  He reports work is going well.   Visit Diagnosis:    ICD-10-CM   1. MDD (major depressive disorder), recurrent, in full remission (HCC)  F33.42   2. GAD (generalized anxiety disorder)  F41.1 sertraline (ZOLOFT) 25 MG tablet   stable  3. Autism spectrum disorder  F84.0   4. Insomnia due to mental  condition  F51.05 sertraline (ZOLOFT) 100 MG tablet   stable    Past Psychiatric History: I have reviewed past psychiatric history from my progress note on 05/14/2018.  Past trials of Zoloft, Depakote, Lamictal-rash, lithium-thyroid problems, Abilify, propranolol  Past Medical History:  Past Medical History:  Diagnosis Date  . Anxiety   . Autism   . Depression   . Hypertension   . Obesity     Past Surgical History:  Procedure Laterality Date  . NO PAST SURGERIES      Family Psychiatric History: Reviewed family psychiatric history from my progress note on 05/14/2018  Family History:  Family History  Problem Relation Age of Onset  . Healthy Mother   . Alcohol abuse Mother   . Anxiety disorder Mother   . Depression Mother   . Seizures Father   . Hypertension Father   . Anxiety disorder Father   . Depression Father   . Heart attack Maternal Grandmother   . Diabetes Maternal Grandmother   . Heart attack Maternal Grandfather   . Anxiety disorder Sister   . Depression Sister     Social History: Reviewed social history from my progress note on 05/14/2018 Social History   Socioeconomic History  . Marital status: Single    Spouse name: Not on file  . Number of children: 0  . Years of education: Not on file  . Highest education level: Associate degree: occupational, Scientist, product/process development, or vocational program  Occupational History  . Not on file  Social Needs  . Financial resource strain: Not hard at all  . Food insecurity    Worry: Never true    Inability: Never true  . Transportation needs    Medical: No    Non-medical: No  Tobacco Use  . Smoking status: Never Smoker  . Smokeless tobacco: Never Used  Substance and Sexual Activity  . Alcohol use: Not Currently    Alcohol/week: 1.0 standard drinks    Types: 1 Cans of beer per week  . Drug use: Never  . Sexual activity: Not Currently  Lifestyle  . Physical activity    Days per week: 0 days    Minutes per session: 0 min   . Stress: Not at all  Relationships  . Social Herbalist on phone: Once a week    Gets together: Once a week    Attends religious service: Never    Active member of club or organization: Yes    Attends meetings of clubs or organizations: More than 4 times per year    Relationship status: Never married  Other Topics Concern  . Not on file  Social History Narrative  . Not on file    Allergies:  Allergies  Allergen Reactions  . Amoxicillin Hives  . Cat Hair Extract     Other reaction(s): Unknown  . Cephalexin Hives  . Lamotrigine     Other reaction(s): Unknown SEVERE RASH THAT WAS LIKE SJ SYNDROME  . Lithium     Other reaction(s): Unknown Thyroid issues - increased?  . Penicillins Rash    Unsure    Metabolic Disorder Labs: Lab Results  Component Value Date   HGBA1C 5.5 09/30/2018   No results found for: PROLACTIN Lab Results  Component Value Date   CHOL 207 (H) 09/30/2018   TRIG 136 09/30/2018   HDL 39 (L) 09/30/2018   CHOLHDL 5.3 (H) 09/30/2018   LDLCALC 141 (H) 09/30/2018   LDLCALC 118 (H) 03/06/2018   Lab Results  Component Value Date   TSH 2.640 09/30/2018   TSH 1.670 03/06/2018    Therapeutic Level Labs: No results found for: LITHIUM No results found for: VALPROATE No components found for:  CBMZ  Current Medications: Current Outpatient Medications  Medication Sig Dispense Refill  . amLODipine (NORVASC) 10 MG tablet Take 1 tablet (10 mg total) by mouth daily. 90 tablet 3  . lisinopril-hydrochlorothiazide (ZESTORETIC) 20-25 MG tablet Take 1 tablet by mouth daily. 90 tablet 3  . propranolol (INDERAL) 10 MG tablet Take 1 tablet (10 mg total) by mouth 2 (two) times daily as needed. Only for severe anxiety attacks 60 tablet 1  . sertraline (ZOLOFT) 100 MG tablet Take 1 tablet (100 mg total) by mouth daily. To be combined with 25 mg 90 tablet 0  . sertraline (ZOLOFT) 25 MG tablet Take 1 tablet (25 mg total) by mouth daily. Combine with 100 mg 90  tablet 0   No current facility-administered medications for this visit.      Musculoskeletal: Strength & Muscle Tone: UTA Gait & Station: Observed as seated Patient leans: N/A  Psychiatric Specialty Exam: Review of Systems  Psychiatric/Behavioral: Negative for depression, hallucinations, substance abuse and suicidal ideas. The patient is not nervous/anxious.   All other systems reviewed and are negative.   There were no vitals taken for this visit.There is no height or weight on file to calculate BMI.  General Appearance: Casual  Eye Contact:  Fair  Speech:  Clear and Coherent  Volume:  Normal  Mood:  Euthymic  Affect:  Congruent  Thought Process:  Goal Directed and Descriptions of Associations: Intact  Orientation:  Full (Time, Place, and Person)  Thought Content: Logical   Suicidal Thoughts:  No  Homicidal Thoughts:  No  Memory:  Immediate;   Fair Recent;   Fair Remote;   Fair  Judgement:  Fair  Insight:  Fair  Psychomotor Activity:  Normal  Concentration:  Concentration: Fair and Attention Span: Fair  Recall:  [employer]Fair  Fund of Knowledge: Fair  Language: Fair  Akathisia:  No  Handed:  Right  AIMS (if indicated):denies tremors, rigidity  Assets:  Communication Skills Desire for Improvement Housing Social Support  ADL's:  Intact  Cognition: WNL  Sleep:  Fair   Screenings: PHQ2-9     [facility] Visit from [facility]/10/2019 in [facility] Office Visit from [facility]/30/2018 in [facility] Office Visit from 03/05/2018 in [facility]  PHQ-2 Total Score  2  1  4   PHQ-9 Total Score  5  2  17        Assessment and Plan: Ryan Ellis is a 28 year old Caucasian male, single, lives [place], employed, has a history of depression, anxiety, autism spectrum disorder, OSA on CPAP, hypertension, morbid obesity was evaluated by telemedicine today.  Patient is biologically predisposed given his multiple health problems, autism spectrum disorder  and family history of mental health problems.  Patient is currently doing well on current medication regimen.  Plan as noted below.  Plan MDD in remission Zoloft 125 mg p.o. daily Continue psychotherapy with therapist Mr.Mustafa   GAD-stable Zoloft 125 mg p.o. daily Propranolol 10 mg p.o. daily as needed for anxiety symptoms.  Insomnia-stable Patient is compliant with CPAP for OSA Sleep hygiene techniques discussed.  Panic attacks-stable Zoloft as prescribed Continue CBT  Follow-up in clinic in 3 months or sooner if needed.  February 4 at 4 PM  I have spent atleast 15 minutes non face to face with patient today. More than 50 % of the time was spent for psychoeducation and supportive psychotherapy and care coordination. This note was generated in part or whole with voice recognition software. Voice recognition is usually quite accurate but there are transcription errors that can and very often do occur. I apologize for any typographical errors that were not detected and corrected.      Jomarie LongsSaramma Madison Direnzo, MD 07/09/2019, 4:13 PM

## 2019-08-05 ENCOUNTER — Ambulatory Visit: Payer: 59 | Admitting: Physician Assistant

## 2019-08-05 ENCOUNTER — Encounter: Payer: Self-pay | Admitting: Physician Assistant

## 2019-08-05 ENCOUNTER — Other Ambulatory Visit: Payer: Self-pay

## 2019-08-05 ENCOUNTER — Ambulatory Visit: Payer: Self-pay | Admitting: Physician Assistant

## 2019-08-05 VITALS — BP 134/87 | HR 93 | Temp 97.1°F | Wt >= 6400 oz

## 2019-08-05 DIAGNOSIS — Z23 Encounter for immunization: Secondary | ICD-10-CM | POA: Diagnosis not present

## 2019-08-05 NOTE — Patient Instructions (Signed)

## 2019-08-05 NOTE — Progress Notes (Signed)
Patient: Ryan Ellis Male    DOB: 1991-08-14   28 y.o.   MRN: [id number] Visit Date: 08/05/2019  Today's Provider: Trey Sailors, PA-C   Chief Complaint  Patient presents with  . Weight Loss   Subjective:     HPI Patient presents today for possible weight loss. He has made weight loss attempts and been referred to bariatric surgery. His insurance does not cover bariatric surgery. He talked to his boss who thought a letter to the insurance company might help with coverage of this. He has attempted the keto diet but did not find this sustained weight loss. He is working on Engineer, materials. Finds it difficult to exercise at current weight.   He continues to deal with HTN and sleep apnea as consequences of his severe obesity.   Wt Readings from Last 3 Encounters:  08/05/19 (!) 453 lb (205.5 kg)  04/09/19 (!) 466 lb (211.4 kg)  09/30/18 (!) 470 lb (213.2 kg)   BMI Readings from Last 5 Encounters:  08/05/19 61.44 kg/m  04/09/19 63.20 kg/m  09/30/18 63.74 kg/m  06/10/18 62.52 kg/m  04/19/18 62.12 kg/m     Allergies  Allergen Reactions  . Amoxicillin Hives  . Cat Hair Extract     Other reaction(s): Unknown  . Cephalexin Hives  . Lamotrigine     Other reaction(s): Unknown SEVERE RASH THAT WAS LIKE SJ SYNDROME  . Lithium     Other reaction(s): Unknown Thyroid issues - increased?  . Penicillins Rash    Unsure     Current Outpatient Medications:  .  amLODipine (NORVASC) 10 MG tablet, Take 1 tablet (10 mg total) by mouth daily., Disp: 90 tablet, Rfl: 3 .  lisinopril-hydrochlorothiazide (ZESTORETIC) 20-25 MG tablet, Take 1 tablet by mouth daily., Disp: 90 tablet, Rfl: 3 .  propranolol (INDERAL) 10 MG tablet, Take 1 tablet (10 mg total) by mouth 2 (two) times daily as needed. Only for severe anxiety attacks, Disp: 60 tablet, Rfl: 1 .  sertraline (ZOLOFT) 100 MG tablet, Take 1 tablet (100 mg total) by mouth daily. To be combined with 25 mg, Disp: 90 tablet,  Rfl: 0 .  sertraline (ZOLOFT) 25 MG tablet, Take 1 tablet (25 mg total) by mouth daily. Combine with 100 mg, Disp: 90 tablet, Rfl: 0  Review of Systems  Constitutional: Negative.   Respiratory: Negative.   Genitourinary: Negative.   Psychiatric/Behavioral: Negative.     Social History   Tobacco Use  . Smoking status: Never Smoker  . Smokeless tobacco: Never Used  Substance Use Topics  . Alcohol use: Not Currently    Alcohol/week: 1.0 standard drinks    Types: 1 Cans of beer per week      Objective:   BP 134/87 (BP Location: Right Wrist, Patient Position: Sitting, Cuff Size: Normal)   Pulse 93   Temp (!) 97.1 F (36.2 C) (Temporal)   Wt (!) 453 lb (205.5 kg)   BMI 61.44 kg/m  Vitals:   08/05/19 1440  BP: 134/87  Pulse: 93  Temp: (!) 97.1 F (36.2 C)  TempSrc: Temporal  Weight: (!) 453 lb (205.5 kg)  Body mass index is 61.44 kg/m.   Physical Exam Constitutional:      Appearance: Normal appearance. He is obese.  Skin:    General: Skin is warm and dry.  Neurological:     Mental Status: He is alert and oriented to person, place, and time. Mental status is at baseline.  Psychiatric:  Mood and Affect: Mood normal.        Behavior: Behavior normal.      No results found for any visits on 08/05/19.     Assessment & Plan    1. Morbid obesity ([place])  I have written the letter today and provided it to the patient. I really do think bariatric surgery is the only way for this young man to lose weight in a meaningful and significant way. I think he will continue to suffer negative health effects secondary to his weight until he does so.   2. Need for influenza vaccination  - Flu Vaccine QUAD 6+ mos PF IM (Fluarix Quad PF)  The entirety of the information documented in the History of Present Illness, Review of Systems and Physical Exam were personally obtained by me. Portions of this information were initially documented by [facility], CMA and reviewed  by me for thoroughness and accuracy.   I have spent 15 minutes with this patient, >50% of which was spent on counseling and coordination of care.         Trinna Post, PA-C  [facility]

## 2019-10-09 ENCOUNTER — Ambulatory Visit (INDEPENDENT_AMBULATORY_CARE_PROVIDER_SITE_OTHER): Payer: 59 | Admitting: Psychiatry

## 2019-10-09 ENCOUNTER — Encounter: Payer: Self-pay | Admitting: Psychiatry

## 2019-10-09 ENCOUNTER — Other Ambulatory Visit: Payer: Self-pay

## 2019-10-09 DIAGNOSIS — F411 Generalized anxiety disorder: Secondary | ICD-10-CM | POA: Diagnosis not present

## 2019-10-09 DIAGNOSIS — F840 Autistic disorder: Secondary | ICD-10-CM | POA: Diagnosis not present

## 2019-10-09 DIAGNOSIS — F3342 Major depressive disorder, recurrent, in full remission: Secondary | ICD-10-CM | POA: Insufficient documentation

## 2019-10-09 DIAGNOSIS — F5105 Insomnia due to other mental disorder: Secondary | ICD-10-CM

## 2019-10-09 MED ORDER — SERTRALINE HCL 25 MG PO TABS: 25.0000 mg | ORAL_TABLET | Freq: Every day | ORAL | 0 refills | Status: DC

## 2019-10-09 MED ORDER — SERTRALINE HCL 100 MG PO TABS: 100.0000 mg | ORAL_TABLET | Freq: Every day | ORAL | 0 refills | Status: DC

## 2019-10-09 NOTE — Progress Notes (Signed)
Provider Location : [facility] Patient Location : Home  Virtual Visit via Video Note  I connected with Ryan Ellis on 10/09/19 at  4:00 PM EST by a video enabled telemedicine application and verified that I am speaking with the correct person using two identifiers.   I discussed the limitations of evaluation and management by telemedicine and the availability of in person appointments. The patient expressed understanding and agreed to proceed.     I discussed the assessment and treatment plan with the patient. The patient was provided an opportunity to ask questions and all were answered. The patient agreed with the plan and demonstrated an understanding of the instructions.   The patient was advised to call back or seek an in-person evaluation if the symptoms worsen or if the condition fails to improve as anticipated.                                                                                   MD OP Progress Note  10/09/2019 4:12 PM Ryan Ellis  MRN:  [id number]  Chief Complaint:  Chief Complaint    Follow-up     HPI: Ryan Ellis is a 29 year old Caucasian male, single, employed, lives in [place], has a history of MDD, GAD, autism spectrum disorder, insomnia, OSA on CPAP, hypertension, morbid obesity was evaluated by telemedicine today.  Patient today reports he is currently doing well with regards to his mood symptoms.  He denies any anxiety attacks or panic attacks.  He denies any symptoms of depression.  Patient reports he is compliant on his medications as prescribed.  He reports he has been noncompliant with psychotherapy sessions however will reach out to his therapist soon.  The telemedicine sessions were not working well for him with regards to his therapy.  He reports work is going well.  He denies any other concerns today. Visit Diagnosis:    ICD-10-CM   1. MDD (major depressive disorder), recurrent, in full remission (HCC)  F33.42   2. GAD (generalized anxiety disorder)   F41.1 sertraline (ZOLOFT) 25 MG tablet   stable  3. Autism spectrum disorder  F84.0   4. Insomnia due to mental condition  F51.05 sertraline (ZOLOFT) 100 MG tablet   stable    Past Psychiatric History: I have reviewed past psychiatric history from my progress note on 05/14/2018.  Past trials of Zoloft, Depakote, Lamictal-rash, lithium-thyroid problems, Abilify, propranolol.  Past Medical History:  Past Medical History:  Diagnosis Date  . Anxiety   . Autism   . Depression   . Hypertension   . Obesity     Past Surgical History:  Procedure Laterality Date  . NO PAST SURGERIES      Family Psychiatric History: Reviewed family psychiatric history from my progress note on 05/14/2018.  Family History:  Family History  Problem Relation Age of Onset  . Healthy Mother   . Alcohol abuse Mother   . Anxiety disorder Mother   . Depression Mother   . Seizures Father   . Hypertension Father   . Anxiety disorder Father   . Depression Father   . Heart attack Maternal Grandmother   . Diabetes Maternal Grandmother   . Heart attack  Maternal Grandfather   . Anxiety disorder Sister   . Depression Sister     Social History: Reviewed social history from my progress note on 05/14/2018. Social History   Socioeconomic History  . Marital status: Single    Spouse name: Not on file  . Number of children: 0  . Years of education: Not on file  . Highest education level: Associate degree: occupational, Scientist, product/process development, or vocational program  Occupational History  . Not on file  Tobacco Use  . Smoking status: Never Smoker  . Smokeless tobacco: Never Used  Substance and Sexual Activity  . Alcohol use: Not Currently    Alcohol/week: 1.0 standard drinks    Types: 1 Cans of beer per week  . Drug use: Never  . Sexual activity: Not Currently  Other Topics Concern  . Not on file  Social History Narrative  . Not on file   Social Determinants of Health   Financial Resource Strain:   . Difficulty of  Paying Living Expenses: Not on file  Food Insecurity:   . Worried About Programme researcher, broadcasting/film/video in the Last Year: Not on file  . Ran Out of Food in the Last Year: Not on file  Transportation Needs:   . Lack of Transportation (Medical): Not on file  . Lack of Transportation (Non-Medical): Not on file  Physical Activity:   . Days of Exercise per Week: Not on file  . Minutes of Exercise per Session: Not on file  Stress:   . Feeling of Stress : Not on file  Social Connections:   . Frequency of Communication with Friends and Family: Not on file  . Frequency of Social Gatherings with Friends and Family: Not on file  . Attends Religious Services: Not on file  . Active Member of Clubs or Organizations: Not on file  . Attends Banker Meetings: Not on file  . Marital Status: Not on file    Allergies:  Allergies  Allergen Reactions  . Amoxicillin Hives  . Cat Hair Extract     Other reaction(s): Unknown  . Cephalexin Hives  . Lamotrigine     Other reaction(s): Unknown SEVERE RASH THAT WAS LIKE SJ SYNDROME  . Lithium     Other reaction(s): Unknown Thyroid issues - increased?  . Penicillins Rash    Unsure    Metabolic Disorder Labs: Lab Results  Component Value Date   HGBA1C 5.5 09/30/2018   No results found for: PROLACTIN Lab Results  Component Value Date   CHOL 207 (H) 09/30/2018   TRIG 136 09/30/2018   HDL 39 (L) 09/30/2018   CHOLHDL 5.3 (H) 09/30/2018   LDLCALC 141 (H) 09/30/2018   LDLCALC 118 (H) 03/06/2018   Lab Results  Component Value Date   TSH 2.640 09/30/2018   TSH 1.670 03/06/2018    Therapeutic Level Labs: No results found for: LITHIUM No results found for: VALPROATE No components found for:  CBMZ  Current Medications: Current Outpatient Medications  Medication Sig Dispense Refill  . amLODipine (NORVASC) 10 MG tablet Take 1 tablet (10 mg total) by mouth daily. 90 tablet 3  . lisinopril-hydrochlorothiazide (ZESTORETIC) 20-25 MG tablet Take 1  tablet by mouth daily. 90 tablet 3  . propranolol (INDERAL) 10 MG tablet Take 1 tablet (10 mg total) by mouth 2 (two) times daily as needed. Only for severe anxiety attacks 60 tablet 1  . sertraline (ZOLOFT) 100 MG tablet Take 1 tablet (100 mg total) by mouth daily. To be combined with  25 mg 90 tablet 0  . sertraline (ZOLOFT) 25 MG tablet Take 1 tablet (25 mg total) by mouth daily. Combine with 100 mg 90 tablet 0   No current facility-administered medications for this visit.     Musculoskeletal: Strength & Muscle Tone: UTA Gait & Station: normal Patient leans: N/A  Psychiatric Specialty Exam: Review of Systems  Psychiatric/Behavioral: Negative for agitation, behavioral problems, confusion, decreased concentration, dysphoric mood, hallucinations, self-injury, sleep disturbance and suicidal ideas. The patient is not nervous/anxious and is not hyperactive.   All other systems reviewed and are negative.   There were no vitals taken for this visit.There is no height or weight on file to calculate BMI.  General Appearance: Casual  Eye Contact:  Fair  Speech:  Normal Rate  Volume:  Normal  Mood:  Euthymic  Affect:  Appropriate  Thought Process:  Goal Directed and Descriptions of Associations: Intact  Orientation:  Full (Time, Place, and Person)  Thought Content: Logical   Suicidal Thoughts:  No  Homicidal Thoughts:  No  Memory:  Immediate;   Fair Recent;   Fair Remote;   Fair  Judgement:  Fair  Insight:  Fair  Psychomotor Activity:  Normal  Concentration:  Concentration: Fair and Attention Span: Fair  Recall:  [employer] of Knowledge: Fair  Language: Fair  Akathisia:  No  Handed:  Right  AIMS (if indicated): Denies tremors, rigidity  Assets:  Communication Skills Desire for Improvement Housing Social Support  ADL's:  Intact  Cognition: WNL  Sleep:  Fair   Screenings: PHQ2-9     Office Visit from 04/10/2019 in [place] Visit from  09/30/2018 in [place] Visit from 03/05/2018 in [place]  PHQ-2 Total Score  2  1  4   PHQ-9 Total Score  5  2  17        Assessment and Plan: Aundrea is a 29 year old Caucasian male, single, lives in [place], employed, has a history of depression, anxiety, autism spectrum disorder, OSA on CPAP, hypertension, morbid obesity was evaluated by telemedicine today.  Patient is biologically predisposed given his multiple health problems, autism spectrum disorder, family history of mental health problems.  Patient is currently doing well on current medication regimen.  Plan as noted below.  Plan MDD in remission Zoloft 125 mg p.o. daily Continue psychotherapy sessions with therapist Mr. Ceasar Lund.   GAD-stable Zoloft 125 mg p.o. daily Propranolol 10 mg p.o. daily as needed for anxiety symptoms  Insomnia-stable Patient is compliant with CPAP for OSA.  Panic attacks-stable Zoloft as prescribed Continue CBT  Follow-up in clinic in 3 months or sooner if needed.  May 6 at 4 PM  I have spent atleast 20 minutes non face to face with patient today. More than 50 % of the time was spent for  ordering medications and test ,psychoeducation and supportive psychotherapy and care coordination,as well as documenting clinical information in electronic health record. This note was generated in part or whole with voice recognition software. Voice recognition is usually quite accurate but there are transcription errors that can and very often do occur. I apologize for any typographical errors that were not detected and corrected.      Ursula Alert, MD 10/09/2019, 4:12 PM

## 2019-10-10 ENCOUNTER — Ambulatory Visit: Payer: 59 | Admitting: Physician Assistant

## 2019-10-10 ENCOUNTER — Other Ambulatory Visit: Payer: Self-pay

## 2019-10-10 ENCOUNTER — Telehealth: Payer: Self-pay | Admitting: Physician Assistant

## 2019-10-10 ENCOUNTER — Encounter: Payer: Self-pay | Admitting: Physician Assistant

## 2019-10-10 VITALS — BP 118/78 | HR 92 | Temp 96.9°F | Wt >= 6400 oz

## 2019-10-10 DIAGNOSIS — F411 Generalized anxiety disorder: Secondary | ICD-10-CM

## 2019-10-10 DIAGNOSIS — F419 Anxiety disorder, unspecified: Secondary | ICD-10-CM | POA: Diagnosis not present

## 2019-10-10 DIAGNOSIS — I10 Essential (primary) hypertension: Secondary | ICD-10-CM

## 2019-10-10 DIAGNOSIS — F32A Depression, unspecified: Secondary | ICD-10-CM

## 2019-10-10 DIAGNOSIS — F329 Major depressive disorder, single episode, unspecified: Secondary | ICD-10-CM

## 2019-10-10 NOTE — Patient Instructions (Signed)
Bariatric Surgery Information Bariatric surgery, also called weight loss surgery, is a procedure that helps you lose weight. You may consider, or your health care provider may suggest, bariatric surgery if:  You are severely obese and have been unable to lose weight through diet and exercise.  You have health problems related to obesity, such as: ? Type 2 diabetes. ? Heart disease. ? Lung disease. How does bariatric surgery help me lose weight? Bariatric surgery helps you lose weight by:  Decreasing how much food your body absorbs. This is done by closing off part of your stomach to make it smaller. This restricts the amount of food your stomach can hold.  Changing your body's regular digestive process so that food bypasses the parts of your body that absorb calories and nutrients. If you decide to have bariatric surgery, it is important to continue to eat a healthy diet and exercise regularly after the surgery. What are the different kinds of bariatric surgery? There are two kinds of bariatric surgeries:  Restrictive surgery. This procedure makes your stomach smaller. It does not change your digestive process. The smaller the size of your new stomach, the less food you can eat. There are different types of restrictive surgeries.  Malabsorptive surgery. This procedure makes your stomach smaller and alters your digestive process so that your body processes less calories and nutrients. These are the most common kind of bariatric surgery. There are different types of malabsorptive surgeries. What are the different types of restrictive surgery? Adjustable Gastric Banding In this procedure, an inflatable band is placed around your stomach near the upper end. This makes the passageway for food into the rest of your stomach much smaller. The band can be adjusted, making it tighter or looser, by filling it with salt solution. Your surgeon can adjust the band based on how you are feeling and how much  weight you are losing. The band can be removed in the future. This requires another surgery. Sleeve Gastrectomy In this procedure, your stomach is made smaller. This is done by surgically removing a large part of your stomach. When your stomach is smaller, you feel full more quickly and reduce how much you eat. What are the different types of malabsorptive surgery?  Roux-en-Y Gastric Bypass (RGB) This is the most common weight loss surgery. In this procedure, a small stomach pouch (gastric pouch) is created in the upper part of your stomach. Next, this gastric pouch is attached directly to the middle part of your small intestine. The farther down your small intestine the new connection is made, the fewer calories and nutrients you will absorb. This surgery has the highest rate of complications. Biliopancreatic Diversion with Duodenal Switch (BPD/DS) This is a multi-step procedure. First, a large part of your stomach is removed, making your stomach smaller. Next, this smaller stomach is attached to the lower part of your small intestine. Like the RGB surgery, you absorb fewer calories and nutrients the farther down your small intestine the attachment is made. What are the risks of bariatric surgery? As with any surgical procedure, each type of bariatric surgery has its own risks. These risks also depend on your age, your overall health, and any other medical conditions you may have. When deciding on bariatric surgery, it is very important to:  Talk to your health care provider and choose the surgery that is best for you.  Ask your health care provider about specific risks for the surgery you choose. Generally, the risks of bariatric surgery include:    Infection.  Bleeding.  Not getting enough nutrients from food (nutritional deficiencies).  Failure of the device or procedure. This may require another surgery to correct the problem. Where to find more information  American Society for  Metabolic & Bariatric Surgery: www.asmbs.org  Weight-control Information Network (WIN): win.niddk.nih.gov Summary  Bariatric surgery, also called weight loss surgery, is a procedure that helps you lose weight.  This surgery may be recommended if you have diabetes, heart disease, or lung disease.  Generally, risks of bariatric surgery include infection, bleeding, and failure of the surgery or device, which may require another surgery to correct the problem. This information is not intended to replace advice given to you by your health care provider. Make sure you discuss any questions you have with your health care provider. Document Revised: 12/10/2018 Document Reviewed: 09/25/2016 Elsevier Patient Education  2020 Elsevier Inc.  

## 2019-10-10 NOTE — Progress Notes (Signed)
Patient: Ryan Ellis Male    DOB: [birth year]   29 y.o.   MRN: [id number] Visit Date: 10/10/2019  Today's Provider: Trinna Post, PA-C   Chief Complaint  Patient presents with  . Hypertension   Subjective:     HPI  Hypertension, follow-up:  BP Readings from Last 3 Encounters:  10/10/19 118/78  08/05/19 134/87  04/09/19 135/82    He was last seen for hypertension 6 months ago.  BP at that visit was normotensive. Management changes since that visit include continue amlodipine 10 MG tablet & lisinopril-hydrochlorothiazide 20-25 MG tablet. He reports good compliance with treatment. He is not having side effects.  He is exercising. He is adherent to low salt diet.   Outside blood pressures are normal. He is experiencing none.  Patient denies chest pain, chest pressure/discomfort, fatigue, irregular heart beat, lower extremity edema and palpitations.   Cardiovascular risk factors include hypertension, male gender and obesity (BMI >= 30 kg/m2).  Use of agents associated with hypertension: none.     Weight trend: stable Wt Readings from Last 3 Encounters:  10/10/19 (!) 466 lb 8 oz (211.6 kg)  08/05/19 (!) 453 lb (205.5 kg)  04/09/19 (!) 466 lb (211.4 kg)     [facility] Prior Authorization: [id number], completed virtual seminar for [facility]. Patient has been attempting to get evaluated for bariatric surgery but insurance has been a hurdle. They have advised him his PCP needs to call and speak with insurance company.   BP Readings from Last 3 Encounters:  10/10/19 118/78  08/05/19 134/87  04/09/19 135/82    ------------------------------------------------------------------------     Allergies  Allergen Reactions  . Amoxicillin Hives  . Cat Hair Extract     Other reaction(s): Unknown  . Cephalexin Hives  . Lamotrigine     Other reaction(s): Unknown SEVERE RASH THAT WAS LIKE SJ SYNDROME  . Lithium     Other reaction(s):  Unknown Thyroid issues - increased?  . Penicillins Rash    Unsure     Current Outpatient Medications:  .  amLODipine (NORVASC) 10 MG tablet, Take 1 tablet (10 mg total) by mouth daily., Disp: 90 tablet, Rfl: 3 .  lisinopril-hydrochlorothiazide (ZESTORETIC) 20-25 MG tablet, Take 1 tablet by mouth daily., Disp: 90 tablet, Rfl: 3 .  propranolol (INDERAL) 10 MG tablet, Take 1 tablet (10 mg total) by mouth 2 (two) times daily as needed. Only for severe anxiety attacks, Disp: 60 tablet, Rfl: 1 .  sertraline (ZOLOFT) 100 MG tablet, Take 1 tablet (100 mg total) by mouth daily. To be combined with 25 mg, Disp: 90 tablet, Rfl: 0 .  sertraline (ZOLOFT) 25 MG tablet, Take 1 tablet (25 mg total) by mouth daily. Combine with 100 mg, Disp: 90 tablet, Rfl: 0  Review of Systems  Constitutional: Negative.   Respiratory: Negative.   Cardiovascular: Negative.   Hematological: Negative.     Social History   Tobacco Use  . Smoking status: Never Smoker  . Smokeless tobacco: Never Used  Substance Use Topics  . Alcohol use: Not Currently    Alcohol/week: 1.0 standard drinks    Types: 1 Cans of beer per week      Objective:   BP 118/78 (BP Location: Right Wrist, Patient Position: Sitting, Cuff Size: Normal)   Pulse 92   Temp (!) 96.9 F (36.1 C) (Temporal)   Wt (!) 466 lb 8 oz (211.6 kg)   SpO2 97%   BMI 63.27 kg/m  Vitals:   10/10/19 1359  BP: 118/78  Pulse: 92  Temp: (!) 96.9 F (36.1 C)  TempSrc: Temporal  SpO2: 97%  Weight: (!) 466 lb 8 oz (211.6 kg)  Body mass index is 63.27 kg/m.   Physical Exam Constitutional:      Appearance: Normal appearance. He is obese.  Cardiovascular:     Rate and Rhythm: Normal rate and regular rhythm.     Heart sounds: Normal heart sounds.  Pulmonary:     Effort: Pulmonary effort is normal.     Breath sounds: Normal breath sounds.  Skin:    General: Skin is warm and dry.  Neurological:     Mental Status: He is alert and oriented to person,  place, and time. Mental status is at baseline.  Psychiatric:        Mood and Affect: Mood normal.        Behavior: Behavior normal.      No results found for any visits on 10/10/19.     Assessment & Plan    1. Morbid obesity (HCC)  Trying to get evaluated with bariatric surgery. Patient has not been to initial evaluation due to insurance. We have called insurance company who has stated they need [place] to provide procedure code. This is somewhat puzzling to me however as since he has not been evaluated, I am unsure what procedure code would be used. He might qualify for multiple different procedures including gastri sleeve or Roux en Y. Advised to consult with surgical clinic about the potential procedure codes that they use.   - Comprehensive Metabolic Panel (CMET) - CBC with Differential - Lipid Profile - TSH - HgB A1c  2. GAD (generalized anxiety disorder)  - Comprehensive Metabolic Panel (CMET) - CBC with Differential  3. Anxiety and depression   4. Hypertension   The entirety of the information documented in the History of Present Illness, Review of Systems and Physical Exam were personally obtained by me. Portions of this information were initially documented by [facility] and reviewed by me for thoroughness and accuracy.   F/u 6 months    Trey Sailors, PA-C  [facility]

## 2019-10-10 NOTE — Telephone Encounter (Signed)
We've been trying to get this patient into bariatric surgery. He told me his insurance needs a PA. He gave me the general number for this insurance number which is [phone]. Can we call and check if there is a direct number or case to do a prior authorization.

## 2019-10-10 NOTE — Telephone Encounter (Signed)
Spoke with a representative at [employer] and they stated that they are needing a procedure code for the patient and some other information about the procedure. I called the patient and he states that [facility] told him to contact his insurance company about the procedure and he did and [employer] company want him to reach out his PCP.

## 2019-10-13 NOTE — Telephone Encounter (Signed)
I do not know the procedure code because I don't do the procedures nor do I know what procedure he would get. The surgeon might recommend a gastric sleeve surgery or might recommend the Roux-en-Y surgery. That clinic should be able to provide the codes.

## 2019-10-14 NOTE — Telephone Encounter (Signed)
Pt called stating that the procedure codes gastric bypass 61518 and Gastric sleeve 43775. Please advise.

## 2019-10-16 NOTE — Telephone Encounter (Signed)
Spoke with a representative at [employer] to advice them of the codes patient provided and the representative states that they have already received information from the surgery office and they do not understand why the patient would have [place] contacting them for something that this office is not doing. The representative states that the patient was giving the wrong information and advised me to have patient to contact them his self. I advise patient to reach out to insurance company and advise them of everything and the procedure codes he gave [place].FYI

## 2019-10-16 NOTE — Telephone Encounter (Signed)
You can provide the codes to the insurance company as potential procedures, though I don't know which one would be done.

## 2019-11-04 ENCOUNTER — Other Ambulatory Visit: Payer: Self-pay | Admitting: Surgery

## 2019-11-11 ENCOUNTER — Ambulatory Visit
Admission: RE | Admit: 2019-11-11 | Discharge: 2019-11-11 | Disposition: A | Discharge status: Home / Self Care | Payer: 59 | Source: Ambulatory Visit | Attending: Surgery | Admitting: Surgery

## 2019-11-11 ENCOUNTER — Other Ambulatory Visit: Payer: Self-pay

## 2019-11-13 ENCOUNTER — Other Ambulatory Visit: Payer: Self-pay

## 2019-11-13 ENCOUNTER — Encounter: Payer: 59 | Attending: Surgery | Admitting: Skilled Nursing Facility1

## 2019-11-13 ENCOUNTER — Encounter: Payer: Self-pay | Admitting: Skilled Nursing Facility1

## 2019-11-13 DIAGNOSIS — G4730 Sleep apnea, unspecified: Secondary | ICD-10-CM | POA: Diagnosis not present

## 2019-11-13 DIAGNOSIS — Z713 Dietary counseling and surveillance: Secondary | ICD-10-CM | POA: Insufficient documentation

## 2019-11-13 DIAGNOSIS — E669 Obesity, unspecified: Secondary | ICD-10-CM | POA: Insufficient documentation

## 2019-11-13 DIAGNOSIS — I10 Essential (primary) hypertension: Secondary | ICD-10-CM | POA: Diagnosis not present

## 2019-11-13 DIAGNOSIS — F329 Major depressive disorder, single episode, unspecified: Secondary | ICD-10-CM | POA: Diagnosis not present

## 2019-11-13 DIAGNOSIS — F419 Anxiety disorder, unspecified: Secondary | ICD-10-CM | POA: Insufficient documentation

## 2019-11-13 DIAGNOSIS — Z6841 Body Mass Index (BMI) 40.0 and over, adult: Secondary | ICD-10-CM | POA: Diagnosis not present

## 2019-11-13 NOTE — Progress Notes (Signed)
Nutrition Assessment for Bariatric Surgery Medical Nutrition Therapy  Patient was seen on 11/13/2019 for Pre-Operative Nutrition Assessment. Letter of approval faxed to [facility] bariatric surgery program coordinator on 11/13/2019   Referral stated Supervised Weight Loss (SWL) visits needed: 0  Planned surgery: RYGB Pt expectation of surgery: to be healthy Pt expectation of dietitian: none stated    NUTRITION ASSESSMENT   Anthropometrics  Start weight at [facility]: 478 lbs (date: 11/13/2019)  Height: 72 in BMI: 64.83 kg/m2     Clinical  Medical hx: hypertension, sleep apnea, depression/anxiety Medications: see list Labs:  Notable signs/symptoms:   Lifestyle & Dietary Hx  Pt states he is currently working in his first full time job. Pt states he knows how to cook but does not like to.   Pt states he recognizes if he does not make emotional changes he will not be successful with surgery.  Pt states he never cooks non starchy vegetables. Pt will work with dietitian before he has pre-op diet to help him implement some changes pre surgery.   24-Hr Dietary Recall First Meal: skipped Snack:  Second Meal 1pm: frozen chicken tenders with frozen fries Snack: cheese or meat or maybe chips Third Meal: frozen pizza  Snack:  Beverages:  water, sometimes diet soda   Estimated Energy Needs Calories: 1800   NUTRITION DIAGNOSIS  Overweight/obesity ([place]-3.3) related to past poor dietary habits and physical inactivity as evidenced by patient w/ planned RYGB surgery following dietary guidelines for continued weight loss.    NUTRITION INTERVENTION  Nutrition counseling (C-1) and education (E-2) to facilitate bariatric surgery goals.   Pre-Op Goals Reviewed with the Patient . Track food and beverage intake (pen and paper, MyFitness Pal, Baritastic app, etc.) . Make healthy food choices while monitoring portion sizes . Consume 3 meals per day or try to eat every 3-5  hours . Avoid concentrated sugars and fried foods . Keep sugar & fat in the single digits per serving on food labels . Practice CHEWING your food (aim for applesauce consistency) . Practice not drinking 15 minutes before, during, and 30 minutes after each meal and snack . Avoid all carbonated beverages (ex: soda, sparkling beverages)  . Limit caffeinated beverages (ex: coffee, tea, energy drinks) . Avoid all sugar-sweetened beverages (ex: regular soda, sports drinks)  . Avoid alcohol  . Aim for 64-100 ounces of FLUID daily (with at least half of fluid intake being plain water)  . Aim for at least 60-80 grams of PROTEIN daily . Look for a liquid protein source that contains ?15 g protein and ?5 g carbohydrate (ex: shakes, drinks, shots) . Make a list of non-food related activities . Physical activity is an important part of a healthy lifestyle so keep it moving! The goal is to reach 150 minutes of exercise per week, including cardiovascular and weight baring activity.  Goals: Eat non starchy vegetables with each meal: frozen or canned Do not eat the entire package in one sitting: take out your serving and imediatly put the rest in the fridge  Eat breakfast: yogurt and 2 boiled eggs Eat every 3-5 hours  At next appointment:  Discuss not drinking with meals  *Goals that are bolded indicate the pt would like to start working towards these  Handouts Provided Include  . Bariatric Surgery handouts (Nutrition Visits, Pre-Op Goals, Protein Shakes, Vitamins & Minerals)  Learning Style & Readiness for Change Teaching method utilized: Visual & Auditory  Demonstrated degree of understanding via: Teach Back  Barriers  to learning/adherence to lifestyle change: none stated      MONITORING & EVALUATION Dietary intake, weekly physical activity, body weight, and pre-op goals reached at next nutrition visit.    Next Steps  Patient is to follow up at [facility] for Pre-Op Class >2 weeks before surgery  for further nutrition education.

## 2019-11-25 LAB — CBC WITH DIFFERENTIAL/PLATELET
Basophils Absolute: 0.1 10*3/uL (ref 0.0–0.2)
Basos: 1 %
EOS (ABSOLUTE): 0.1 10*3/uL (ref 0.0–0.4)
Eos: 2 %
Hematocrit: 45.1 % (ref 37.5–51.0)
Hemoglobin: 15.2 g/dL (ref 13.0–17.7)
Immature Grans (Abs): 0.1 10*3/uL (ref 0.0–0.1)
Immature Granulocytes: 1 %
Lymphocytes Absolute: 2.9 10*3/uL (ref 0.7–3.1)
Lymphs: 33 %
MCH: 26.3 pg — ABNORMAL LOW (ref 26.6–33.0)
MCHC: 33.7 g/dL (ref 31.5–35.7)
MCV: 78 fL — ABNORMAL LOW (ref 79–97)
Monocytes Absolute: 0.6 10*3/uL (ref 0.1–0.9)
Monocytes: 7 %
Neutrophils Absolute: 5 10*3/uL (ref 1.4–7.0)
Neutrophils: 56 %
Platelets: 309 10*3/uL (ref 150–450)
RBC: 5.78 x10E6/uL (ref 4.14–5.80)
RDW: 13.4 % (ref 11.6–15.4)
WBC: 8.7 10*3/uL (ref 3.4–10.8)

## 2019-11-25 LAB — LIPID PANEL
Chol/HDL Ratio: 4.9 ratio (ref 0.0–5.0)
Cholesterol, Total: 196 mg/dL (ref 100–199)
HDL: 40 mg/dL (ref >39)
LDL Chol Calc (NIH): 132 mg/dL — ABNORMAL HIGH (ref 0–99)
Triglycerides: 132 mg/dL (ref 0–149)
VLDL Cholesterol Cal: 24 mg/dL (ref 5–40)

## 2019-11-25 LAB — COMPREHENSIVE METABOLIC PANEL
ALT: 33 IU/L (ref 0–44)
AST: 20 IU/L (ref 0–40)
Albumin/Globulin Ratio: 1.8 (ref 1.2–2.2)
Albumin: 4.5 g/dL (ref 4.1–5.2)
Alkaline Phosphatase: 65 IU/L (ref 39–117)
BUN/Creatinine Ratio: 18 (ref 9–20)
BUN: 13 mg/dL (ref 6–20)
Bilirubin Total: 0.3 mg/dL (ref 0.0–1.2)
CO2: 24 mmol/L (ref 20–29)
Calcium: 9.2 mg/dL (ref 8.7–10.2)
Chloride: 98 mmol/L (ref 96–106)
Creatinine, Ser: 0.74 mg/dL — ABNORMAL LOW (ref 0.76–1.27)
GFR calc Af Amer: 146 mL/min/{1.73_m2} (ref >59)
GFR calc non Af Amer: 126 mL/min/{1.73_m2} (ref >59)
Globulin, Total: 2.5 g/dL (ref 1.5–4.5)
Glucose: 81 mg/dL (ref 65–99)
Potassium: 4.3 mmol/L (ref 3.5–5.2)
Sodium: 137 mmol/L (ref 134–144)
Total Protein: 7 g/dL (ref 6.0–8.5)

## 2019-11-25 LAB — HEMOGLOBIN A1C
Est. average glucose Bld gHb Est-mCnc: 111 mg/dL
Hgb A1c MFr Bld: 5.5 % (ref 4.8–5.6)

## 2019-11-25 LAB — TSH: TSH: 2.69 u[IU]/mL (ref 0.450–4.500)

## 2019-11-26 ENCOUNTER — Other Ambulatory Visit: Payer: Self-pay

## 2019-11-26 ENCOUNTER — Encounter: Payer: 59 | Admitting: Skilled Nursing Facility1

## 2019-11-26 DIAGNOSIS — Z713 Dietary counseling and surveillance: Secondary | ICD-10-CM | POA: Diagnosis not present

## 2019-11-26 DIAGNOSIS — E669 Obesity, unspecified: Secondary | ICD-10-CM

## 2019-11-26 NOTE — Progress Notes (Signed)
Supervised Weight Loss Visit Bariatric Nutrition Education  Planned Surgery: RYGB Pt Expectation of Surgery/ Goals: to be healthy   NUTRITION ASSESSMENT  Anthropometrics  Start weight at [facility]: 478 lbs (date: 11/13/2019) Today's weight:473  lbs Weight change: -5 lbs (since previous) BMI: 64.25 kg/m2    Clinical  Medical Hx: Medications:  Labs:  Notable Signs/Symptoms:   Lifestyle & Dietary Hx  Pt states he is Not hungry until midafternoon  Pt arrives having lost about 5 pounds. Pt states he Tried poached eggs accidentally exploded them in the microwave but figured it out. Pt states he forgot to get asparaus and has not been shopping. Pt states he does not shop often. Pt states he has been trying not to snack. Pt states with the basketball games it was hard not to snack. Pt states he has been able to only eat half a pizza at a time but then ate the other half an hour later which he was upset about (tearful throughout). Pt states he recognizes he needs to eat slower. Pt states getting into new haits will take time and be hard but he will give it effort. Pt states he has not eaten any non starchy vegetables. Pt states he does get down on himself for not reaching his goals. Pt states he likes to eat a lot of casseroles. Pt states he has been recognizing more often his fullness which he is excited about. Pt states he has Been pesimistic and will work with therapist on this topic. Pt states he Was able to not eat an entire tub of ice cream which he feels good about.   Goals: Grocery shop every 2 weeks: make a list from the detailed MyPlate Continue to cut your portions in half and put tem in the fridge  Identify Hunger verses Appetite  Reframe your negative thought into a positive thought   Estimated daily fluid intake:  oz Supplements:  Current average weekly physical activity: ADL's  24-Hr Dietary Recall First Meal: skipped Snack:  Second Meal 1pm: frozen chicken tenders with  frozen fries Snack: cheese or meat or maybe chips Third Meal: frozen pizza  Snack:  Beverages:  water, sometimes diet soda  Estimated Energy Needs Calories: 1800  NUTRITION DIAGNOSIS  Overweight/obesity ([place]-3.3) related to past poor dietary habits and physical inactivity as evidenced by patient w/ planned RYGB surgery following dietary guidelines for continued weight loss.   NUTRITION INTERVENTION  Nutrition counseling (C-1) and education (E-2) to facilitate bariatric surgery goals.   Handouts Provided Include   Should I eat flow sheet  Learning Style & Readiness for Change Teaching method utilized: Visual & Auditory  Demonstrated degree of understanding via: Teach Back  Barriers to learning/adherence to lifestyle change: emotional eating  MONITORING & EVALUATION Dietary intake, weekly physical activity, body weight, and pre-op goals in 1 month.   Next Steps  Patient is to return to [facility] for preop class

## 2019-11-27 ENCOUNTER — Telehealth: Payer: Self-pay

## 2019-11-27 NOTE — Telephone Encounter (Signed)
-----   Message from Margaretann Loveless, [place] sent at 11/27/2019 10:29 AM EDT ----- Kidney function is stable. Liver enzymes are normal. Sugar is normal. Sodium, potassium, and calcium is normal. Blood count is normal and stable. Cholesterol is slightly improved from last year. Thyroid is normal.

## 2019-11-27 NOTE — Telephone Encounter (Signed)
Patient notified normal results

## 2019-11-27 NOTE — Telephone Encounter (Signed)
Called patient no answer left voicemail message for patient to call the office back. If patient returns call ok for [facility] to advise patient of message.

## 2019-12-04 ENCOUNTER — Ambulatory Visit (INDEPENDENT_AMBULATORY_CARE_PROVIDER_SITE_OTHER): Payer: 59 | Admitting: Psychology

## 2019-12-04 DIAGNOSIS — F509 Eating disorder, unspecified: Secondary | ICD-10-CM | POA: Diagnosis not present

## 2019-12-17 ENCOUNTER — Ambulatory Visit (INDEPENDENT_AMBULATORY_CARE_PROVIDER_SITE_OTHER): Payer: 59 | Admitting: Psychology

## 2019-12-17 DIAGNOSIS — F509 Eating disorder, unspecified: Secondary | ICD-10-CM | POA: Diagnosis not present

## 2019-12-31 ENCOUNTER — Ambulatory Visit (INDEPENDENT_AMBULATORY_CARE_PROVIDER_SITE_OTHER): Payer: 59 | Admitting: Psychology

## 2019-12-31 DIAGNOSIS — F28 Other psychotic disorder not due to a substance or known physiological condition: Secondary | ICD-10-CM | POA: Diagnosis not present

## 2019-12-31 DIAGNOSIS — F840 Autistic disorder: Secondary | ICD-10-CM | POA: Diagnosis not present

## 2020-01-08 ENCOUNTER — Encounter: Payer: Self-pay | Admitting: Psychiatry

## 2020-01-08 ENCOUNTER — Telehealth (INDEPENDENT_AMBULATORY_CARE_PROVIDER_SITE_OTHER): Payer: 59 | Admitting: Psychiatry

## 2020-01-08 ENCOUNTER — Other Ambulatory Visit: Payer: Self-pay

## 2020-01-08 DIAGNOSIS — F411 Generalized anxiety disorder: Secondary | ICD-10-CM | POA: Diagnosis not present

## 2020-01-08 DIAGNOSIS — F5105 Insomnia due to other mental disorder: Secondary | ICD-10-CM | POA: Diagnosis not present

## 2020-01-08 DIAGNOSIS — F840 Autistic disorder: Secondary | ICD-10-CM | POA: Diagnosis not present

## 2020-01-08 DIAGNOSIS — F330 Major depressive disorder, recurrent, mild: Secondary | ICD-10-CM | POA: Diagnosis not present

## 2020-01-08 MED ORDER — BUPROPION HCL 75 MG PO TABS: 75.0000 mg | ORAL_TABLET | Freq: Every day | ORAL | 1 refills | Status: DC

## 2020-01-08 NOTE — Progress Notes (Signed)
BH MD OP Progress Note   Virtual Visit via Video Note  I connected with Ryan Ellis on 01/08/20 at  4:00 PM EDT by a video enabled telemedicine application and verified that I am speaking with the correct person using two identifiers.   I discussed the limitations of evaluation and management by telemedicine and the availability of in person appointments. The patient expressed understanding and agreed to proceed.    I discussed the assessment and treatment plan with the patient. The patient was provided an opportunity to ask questions and all were answered. The patient agreed with the plan and demonstrated an understanding of the instructions.   The patient was advised to call back or seek an in-person evaluation if the symptoms worsen or if the condition fails to improve as anticipated.   Provider Location : [facility] Patient Location : Home    01/08/2020 6:36 PM Ryan Ellis  MRN:  [id number]  Chief Complaint:  Chief Complaint    Follow-up     HPI: Ryan Ellis is a 29 year old Caucasian male, single, employed, lives in [place], has a history of MDD, GAD, autism spectrum disorder,, insomnia, OSA on CPAP, hypertension, morbid obesity was evaluated by telemedicine today.  Patient today reports he is currently struggling with sadness, lack of motivation on and offf.  He reports it may last for a couple of days and then he feels better.  He reports these depressive symptoms as getting worse since the past few weeks.  He reports he does have psychosocial stressors of his weight gain, the  pandemic and so on.  He reports he is compliant with psychotherapy session and is planning to have more frequent sessions with his therapist.  He reports work is going well.  Patient denies any suicidality however he reports there are episodes of feeling overwhelmed when he feels he does not want to be here anymore.  He however denies active suicidal thoughts or plan.  Patient denies any perceptual  disturbances.    Visit Diagnosis:    ICD-10-CM   1. MDD (major depressive disorder), recurrent episode, mild (HCC)  F33.0 buPROPion (WELLBUTRIN) 75 MG tablet  2. GAD (generalized anxiety disorder)  F41.1   3. Autism spectrum disorder  F84.0   4. Insomnia due to mental condition  F51.05     Past Psychiatric History: I have reviewed past psychiatric history from my progress note on 05/14/2018.  Past trials of Zoloft, Depakote, Lamictal-rash, lithium-thyroid problem, Abilify, propranolol.  Past Medical History:  Past Medical History:  Diagnosis Date  . Anxiety   . Autism   . Depression   . Hypertension   . Obesity   . Sleep apnea     Past Surgical History:  Procedure Laterality Date  . NO PAST SURGERIES      Family Psychiatric History: I have reviewed family psychiatric history from my progress note on 05/14/2018.  Family History:  Family History  Problem Relation Age of Onset  . Healthy Mother   . Alcohol abuse Mother   . Anxiety disorder Mother   . Depression Mother   . Seizures Father   . Hypertension Father   . Anxiety disorder Father   . Depression Father   . Heart attack Maternal Grandmother   . Diabetes Maternal Grandmother   . Heart attack Maternal Grandfather   . Anxiety disorder Sister   . Depression Sister     Social History: I have reviewed social history from my progress note on 05/14/2018. Social History   Socioeconomic  History  . Marital status: Single    Spouse name: Not on file  . Number of children: 0  . Years of education: Not on file  . Highest education level: Associate degree: occupational, Hotel manager, or vocational program  Occupational History  . Not on file  Tobacco Use  . Smoking status: Never Smoker  . Smokeless tobacco: Never Used  Substance and Sexual Activity  . Alcohol use: Not Currently    Alcohol/week: 1.0 standard drinks    Types: 1 Cans of beer per week  . Drug use: Never  . Sexual activity: Not Currently  Other Topics  Concern  . Not on file  Social History Narrative  . Not on file   Social Determinants of Health   Financial Resource Strain:   . Difficulty of Paying Living Expenses:   Food Insecurity:   . Worried About Charity fundraiser in the Last Year:   . Arboriculturist in the Last Year:   Transportation Needs:   . Film/video editor (Medical):   Marland Kitchen Lack of Transportation (Non-Medical):   Physical Activity:   . Days of Exercise per Week:   . Minutes of Exercise per Session:   Stress:   . Feeling of Stress :   Social Connections:   . Frequency of Communication with Friends and Family:   . Frequency of Social Gatherings with Friends and Family:   . Attends Religious Services:   . Active Member of Clubs or Organizations:   . Attends Archivist Meetings:   Marland Kitchen Marital Status:     Allergies:  Allergies  Allergen Reactions  . Amoxicillin Hives  . Cat Hair Extract     Other reaction(s): Unknown  . Cephalexin Hives  . Lamotrigine     Other reaction(s): Unknown SEVERE RASH THAT WAS LIKE SJ SYNDROME  . Lithium     Other reaction(s): Unknown Thyroid issues - increased?  . Penicillins Rash    Unsure    Metabolic Disorder Labs: Lab Results  Component Value Date   HGBA1C 5.5 11/24/2019   No results found for: PROLACTIN Lab Results  Component Value Date   CHOL 196 11/24/2019   TRIG 132 11/24/2019   HDL 40 11/24/2019   CHOLHDL 4.9 11/24/2019   LDLCALC 132 (H) 11/24/2019   LDLCALC 141 (H) 09/30/2018   Lab Results  Component Value Date   TSH 2.690 11/24/2019   TSH 2.640 09/30/2018    Therapeutic Level Labs: No results found for: LITHIUM No results found for: VALPROATE No components found for:  CBMZ  Current Medications: Current Outpatient Medications  Medication Sig Dispense Refill  . amLODipine (NORVASC) 10 MG tablet Take 1 tablet (10 mg total) by mouth daily. 90 tablet 3  . buPROPion (WELLBUTRIN) 75 MG tablet Take 1 tablet (75 mg total) by mouth daily  with breakfast. 30 tablet 1  . lisinopril-hydrochlorothiazide (ZESTORETIC) 20-25 MG tablet Take 1 tablet by mouth daily. 90 tablet 3  . propranolol (INDERAL) 10 MG tablet Take 1 tablet (10 mg total) by mouth 2 (two) times daily as needed. Only for severe anxiety attacks 60 tablet 1  . sertraline (ZOLOFT) 100 MG tablet Take 1 tablet (100 mg total) by mouth daily. To be combined with 25 mg 90 tablet 0  . sertraline (ZOLOFT) 25 MG tablet Take 1 tablet (25 mg total) by mouth daily. Combine with 100 mg 90 tablet 0   No current facility-administered medications for this visit.     Musculoskeletal: Strength &  Muscle Tone: UTA Gait & Station: normal Patient leans: N/A  Psychiatric Specialty Exam: Review of Systems  Psychiatric/Behavioral: Positive for dysphoric mood.  All other systems reviewed and are negative.   There were no vitals taken for this visit.There is no height or weight on file to calculate BMI.  General Appearance: Casual  Eye Contact:  Fair  Speech:  Clear and Coherent  Volume:  Normal  Mood:  Depressed  Affect:  Congruent  Thought Process:  Goal Directed and Descriptions of Associations: Intact  Orientation:  Full (Time, Place, and Person)  Thought Content: Logical   Suicidal Thoughts:  No  Homicidal Thoughts:  No  Memory:  Immediate;   Fair Recent;   Fair Remote;   Fair  Judgement:  Fair  Insight:  Fair  Psychomotor Activity:  Normal  Concentration:  Concentration: Fair and Attention Span: Fair  Recall:  [employer] of Knowledge: Fair  Language: Fair  Akathisia:  No  Handed:  Right  AIMS (if indicated): UTA  Assets:  Communication Skills Desire for Improvement Social Support  ADL's:  Intact  Cognition: WNL  Sleep:  Fair   Screenings: PHQ2-9     Video [facility] from [facility]/08/2020 in [facility] Office Visit from [facility]/10/2019 in [facility] Office Visit from 09/30/2018 in [facility] Office Visit  from 03/05/2018 in [facility]  PHQ-2 Total Score  2  2  1  4   PHQ-9 Total Score  8  5  2  17        Assessment and Plan: Kyo is a 29 year old Caucasian male, single, lives in [place], employed, has a history of depression, anxiety, autism spectrum disorder, OSA on CPAP, hypertension, morbid obesity was evaluated by telemedicine today.  He is biologically predisposed given his multiple health problems, autism spectrum disorder, family history of mental health problems.  Patient with current psychosocial stressors of his weight, the pandemic.  Patient will benefit from medication readjustment since he is currently struggling with depression.  Plan as noted below.  Plan MDD-unstable Zoloft 125 mg p.o. daily Start Wellbutrin 75 mg p.o. daily  GAD-improving Zoloft 125 mg p.o. daily Propranolol 10 mg p.o. daily as needed for anxiety attacks  Insomnia-stable Patient is compliant with CPAP for OSA  Panic attacks-stable Continue CBT with Mr.Osman Mostafa.   Follow-up in clinic in 2 to 3 weeks or sooner if needed.  I have spent atleast 20 minutes non face to face with patient today. More than 50 % of the time was spent for preparing to see the patient ( e.g., review of test, records ), ordering medications and test ,psychoeducation and supportive psychotherapy and care coordination,as well as documenting clinical information in electronic health record. This note was generated in part or whole with voice recognition software. Voice recognition is usually quite accurate but there are transcription errors that can and very often do occur. I apologize for any typographical errors that were not detected and corrected.       34, MD 01/08/2020, 6:36 PM

## 2020-01-16 ENCOUNTER — Ambulatory Visit (INDEPENDENT_AMBULATORY_CARE_PROVIDER_SITE_OTHER): Payer: 59 | Admitting: Psychology

## 2020-01-16 DIAGNOSIS — F3289 Other specified depressive episodes: Secondary | ICD-10-CM

## 2020-01-19 ENCOUNTER — Telehealth: Payer: Self-pay

## 2020-01-19 DIAGNOSIS — F330 Major depressive disorder, recurrent, mild: Secondary | ICD-10-CM

## 2020-01-19 DIAGNOSIS — F840 Autistic disorder: Secondary | ICD-10-CM

## 2020-01-19 DIAGNOSIS — F411 Generalized anxiety disorder: Secondary | ICD-10-CM

## 2020-01-19 DIAGNOSIS — F5105 Insomnia due to other mental disorder: Secondary | ICD-10-CM

## 2020-01-19 MED ORDER — ZIPRASIDONE HCL 20 MG PO CAPS: 20.0000 mg | ORAL_CAPSULE | Freq: Every day | ORAL | 1 refills | Status: DC

## 2020-01-19 NOTE — Telephone Encounter (Signed)
pt called states that he is stop takin the wellbutrin because it is making him irretable

## 2020-01-19 NOTE — Telephone Encounter (Signed)
Returned call to patient.  Discontinue Wellbutrin for side effects. Start Geodon 20 mg p.o. daily with supper.  He has tried it 10 years ago and did well.

## 2020-01-27 ENCOUNTER — Telehealth (INDEPENDENT_AMBULATORY_CARE_PROVIDER_SITE_OTHER): Payer: 59 | Admitting: Psychiatry

## 2020-01-27 ENCOUNTER — Encounter: Payer: Self-pay | Admitting: Psychiatry

## 2020-01-27 ENCOUNTER — Other Ambulatory Visit: Payer: Self-pay

## 2020-01-27 DIAGNOSIS — F840 Autistic disorder: Secondary | ICD-10-CM | POA: Diagnosis not present

## 2020-01-27 DIAGNOSIS — F330 Major depressive disorder, recurrent, mild: Secondary | ICD-10-CM | POA: Insufficient documentation

## 2020-01-27 DIAGNOSIS — F411 Generalized anxiety disorder: Secondary | ICD-10-CM | POA: Diagnosis not present

## 2020-01-27 DIAGNOSIS — F5105 Insomnia due to other mental disorder: Secondary | ICD-10-CM

## 2020-01-27 MED ORDER — SERTRALINE HCL 100 MG PO TABS: 100.0000 mg | ORAL_TABLET | Freq: Every day | ORAL | 0 refills | Status: DC

## 2020-01-27 MED ORDER — SERTRALINE HCL 25 MG PO TABS: 25.0000 mg | ORAL_TABLET | Freq: Every day | ORAL | 0 refills | Status: DC

## 2020-01-27 NOTE — Progress Notes (Signed)
Provider Location : [facility] Patient Location : Home  Virtual Visit via Video Note  I connected with Ryan Ellis on 01/27/20 at  8:30 AM EDT by a video enabled telemedicine application and verified that I am speaking with the correct person using two identifiers.   I discussed the limitations of evaluation and management by telemedicine and the availability of in person appointments. The patient expressed understanding and agreed to proceed.    I discussed the assessment and treatment plan with the patient. The patient was provided an opportunity to ask questions and all were answered. The patient agreed with the plan and demonstrated an understanding of the instructions.   The patient was advised to call back or seek an in-person evaluation if the symptoms worsen or if the condition fails to improve as anticipated.   [place] MD OP Progress Note  01/27/2020 12:24 PM [name]  MRN:  [id number]  Chief Complaint:  Chief Complaint    Follow-up     HPI: Ryan Ellis is a 29 year old Caucasian male, single, employed, lives in [place], has a history of MDD, GAD, autism spectrum disorder, insomnia, OSA on CPAP, hypertension, morbid obesity was evaluated by telemedicine today.  Patient today reports he is currently making progress with regards to his depression.  He reports he feels more motivated and his sadness have improved.  He however reports that Geodon does make him groggy.  Patient reports he also took a melatonin last night and the effect could be adding up and he feels very groggy this morning.  He however reports he likes the effect of Geodon and want to stay on this dosage.  He wonders how many calories of food he needs to take with the Geodon for it to be absorbed since he does not want to take it too early with supper and wants to wait until his bedtime because of the sedation.  Patient reports work continues to be good.  He continues to be in psychotherapy sessions which are  beneficial.  Patient denies any suicidality, homicidality or perceptual disturbances.  Patient denies any other concerns today.  Visit Diagnosis:    ICD-10-CM   1. MDD (major depressive disorder), recurrent episode, mild ([place])  F33.0   2. GAD (generalized anxiety disorder)  F41.1 sertraline (ZOLOFT) 25 MG tablet   stable  3. Autism spectrum disorder  F84.0   4. Insomnia due to mental condition  F51.05 sertraline (ZOLOFT) 100 MG tablet   stable    Past Psychiatric History: I have reviewed past psychiatric history from my progress note on 05/14/2018.  Past trials of Zoloft, Depakote, Lamictal-rash, lithium-thyroid problem, Abilify, propranolol  Past Medical History:  Past Medical History:  Diagnosis Date  . Anxiety   . Autism   . Depression   . Hypertension   . Obesity   . Sleep apnea     Past Surgical History:  Procedure Laterality Date  . NO PAST SURGERIES      Family Psychiatric History: I have reviewed family psychiatric history from my progress note on 05/14/2018  Family History:  Family History  Problem Relation Age of Onset  . Healthy Mother   . Alcohol abuse Mother   . Anxiety disorder Mother   . Depression Mother   . Seizures Father   . Hypertension Father   . Anxiety disorder Father   . Depression Father   . Heart attack Maternal Grandmother   . Diabetes Maternal Grandmother   . Heart attack Maternal Grandfather   . Anxiety  disorder Sister   . Depression Sister     Social History: I have reviewed social history from my progress note on 05/14/2018 Social History   Socioeconomic History  . Marital status: Single    Spouse name: Not on file  . Number of children: 0  . Years of education: Not on file  . Highest education level: Associate degree: occupational, Scientist, product/process development, or vocational program  Occupational History  . Not on file  Tobacco Use  . Smoking status: Never Smoker  . Smokeless tobacco: Never Used  Substance and Sexual Activity  . Alcohol  use: Not Currently    Alcohol/week: 1.0 standard drinks    Types: 1 Cans of beer per week  . Drug use: Never  . Sexual activity: Not Currently  Other Topics Concern  . Not on file  Social History Narrative  . Not on file   Social Determinants of Health   Financial Resource Strain:   . Difficulty of Paying Living Expenses:   Food Insecurity:   . Worried About Programme researcher, broadcasting/film/video in the Last Year:   . Barista in the Last Year:   Transportation Needs:   . Freight forwarder (Medical):   Marland Kitchen Lack of Transportation (Non-Medical):   Physical Activity:   . Days of Exercise per Week:   . Minutes of Exercise per Session:   Stress:   . Feeling of Stress :   Social Connections:   . Frequency of Communication with Friends and Family:   . Frequency of Social Gatherings with Friends and Family:   . Attends Religious Services:   . Active Member of Clubs or Organizations:   . Attends Banker Meetings:   Marland Kitchen Marital Status:     Allergies:  Allergies  Allergen Reactions  . Amoxicillin Hives  . Cat Hair Extract     Other reaction(s): Unknown  . Cephalexin Hives  . Lamotrigine     Other reaction(s): Unknown SEVERE RASH THAT WAS LIKE SJ SYNDROME  . Lithium     Other reaction(s): Unknown Thyroid issues - increased?  . Penicillins Rash    Unsure    Metabolic Disorder Labs: Lab Results  Component Value Date   HGBA1C 5.5 11/24/2019   No results found for: PROLACTIN Lab Results  Component Value Date   CHOL 196 11/24/2019   TRIG 132 11/24/2019   HDL 40 11/24/2019   CHOLHDL 4.9 11/24/2019   LDLCALC 132 (H) 11/24/2019   LDLCALC 141 (H) 09/30/2018   Lab Results  Component Value Date   TSH 2.690 11/24/2019   TSH 2.640 09/30/2018    Therapeutic Level Labs: No results found for: LITHIUM No results found for: VALPROATE No components found for:  CBMZ  Current Medications: Current Outpatient Medications  Medication Sig Dispense Refill  . amLODipine  (NORVASC) 10 MG tablet Take 1 tablet (10 mg total) by mouth daily. 90 tablet 3  . lisinopril-hydrochlorothiazide (ZESTORETIC) 20-25 MG tablet Take 1 tablet by mouth daily. 90 tablet 3  . propranolol (INDERAL) 10 MG tablet Take 1 tablet (10 mg total) by mouth 2 (two) times daily as needed. Only for severe anxiety attacks 60 tablet 1  . sertraline (ZOLOFT) 100 MG tablet Take 1 tablet (100 mg total) by mouth daily. To be combined with 25 mg 90 tablet 0  . sertraline (ZOLOFT) 25 MG tablet Take 1 tablet (25 mg total) by mouth daily. Combine with 100 mg 90 tablet 0  . ziprasidone (GEODON) 20 MG capsule  Take 1 capsule (20 mg total) by mouth daily with supper. 30 capsule 1   No current facility-administered medications for this visit.     Musculoskeletal: Strength & Muscle Tone: UTA Gait & Station: normal Patient leans: N/A  Psychiatric Specialty Exam: Review of Systems  Psychiatric/Behavioral: Positive for dysphoric mood (Improving).  All other systems reviewed and are negative.   There were no vitals taken for this visit.There is no height or weight on file to calculate BMI.  General Appearance: Casual  Eye Contact:  Fair  Speech:  Clear and Coherent  Volume:  Normal  Mood:  Depressed improving  Affect:  Congruent  Thought Process:  Goal Directed and Descriptions of Associations: Intact  Orientation:  Full (Time, Place, and Person)  Thought Content: Logical   Suicidal Thoughts:  No  Homicidal Thoughts:  No  Memory:  Immediate;   Fair Recent;   Fair Remote;   Fair  Judgement:  Fair  Insight:  Fair  Psychomotor Activity:  Normal  Concentration:  Concentration: Fair and Attention Span: Fair  Recall:  [employer] of Knowledge: Fair  Language: Fair  Akathisia:  No  Handed:  Right  AIMS (if indicated): UTA  Assets:  Communication Skills Desire for Improvement Housing Social Support Talents/Skills Transportation Vocational/Educational  ADL's:  Intact  Cognition: WNL  Sleep:   Improving   Screenings: PHQ2-9     Video Visit from 01/08/2020 in [facility] Office Visit from 04/10/2019 in [facility] Office Visit from 09/30/2018 in [facility] Office Visit from 03/05/2018 in [facility]  PHQ-2 Total Score  2  2  1  4   PHQ-9 Total Score  8  5  2  17        Assessment and Plan: Ryan Ellis is a 29 year old Caucasian male, single, lives in [place], employed, has a history of depression, anxiety, autism spectrum disorder, OSA on CPAP, hypertension, morbid obesity was evaluated by telemedicine today.  Patient is biologically predisposed given his multiple health problems, autism spectrum disorder, family history of mental health problems.  Patient with psychosocial stressors of his weight gain, current pandemic.  He however is currently making progress however does have possible grogginess from Geodon.  Plan as noted below.  Plan MDD-improving Zoloft 125 mg p.o. daily Continue Geodon 20 mg p.o. daily with supper. Patient encouraged to take the Geodon 20 mg with 500 cal of food close to bedtime.  GAD-improving Zoloft 125 mg p.o. daily Propranolol 10 mg p.o. daily as needed for anxiety attacks  Insomnia-stable Patient is compliant with CPAP for OSA  Panic attacks-stable Continue CBT with Mr. 34  Follow-up in clinic in 3 to 4 weeks or sooner if needed.  I have spent atleast 20 minutes non face to face with patient today. More than 50 % of the time was spent for preparing to see the patient ( e.g., review of test, records ),  ordering medications and test ,psychoeducation and supportive psychotherapy and care coordination,as well as documenting clinical information in electronic health record. This note was generated in part or whole with voice recognition software. Voice recognition is usually quite accurate but there are transcription errors that can and very often do occur. I  apologize for any typographical errors that were not detected and corrected.       [place], MD 01/27/2020, 12:24 PM

## 2020-01-28 ENCOUNTER — Ambulatory Visit (INDEPENDENT_AMBULATORY_CARE_PROVIDER_SITE_OTHER): Payer: 59 | Admitting: Psychology

## 2020-01-28 DIAGNOSIS — F840 Autistic disorder: Secondary | ICD-10-CM | POA: Diagnosis not present

## 2020-01-28 DIAGNOSIS — F3289 Other specified depressive episodes: Secondary | ICD-10-CM | POA: Diagnosis not present

## 2020-02-04 ENCOUNTER — Ambulatory Visit: Payer: 59 | Admitting: Psychology

## 2020-02-11 ENCOUNTER — Ambulatory Visit: Payer: 59 | Admitting: Psychology

## 2020-02-12 DIAGNOSIS — F509 Eating disorder, unspecified: Secondary | ICD-10-CM | POA: Diagnosis not present

## 2020-02-18 ENCOUNTER — Ambulatory Visit: Payer: 59 | Admitting: Psychology

## 2020-02-23 ENCOUNTER — Other Ambulatory Visit: Payer: Self-pay

## 2020-02-23 ENCOUNTER — Telehealth (INDEPENDENT_AMBULATORY_CARE_PROVIDER_SITE_OTHER): Payer: 59 | Admitting: Psychiatry

## 2020-02-23 ENCOUNTER — Encounter: Payer: Self-pay | Admitting: Psychiatry

## 2020-02-23 DIAGNOSIS — F3342 Major depressive disorder, recurrent, in full remission: Secondary | ICD-10-CM

## 2020-02-23 DIAGNOSIS — F411 Generalized anxiety disorder: Secondary | ICD-10-CM

## 2020-02-23 DIAGNOSIS — F5105 Insomnia due to other mental disorder: Secondary | ICD-10-CM

## 2020-02-23 DIAGNOSIS — F840 Autistic disorder: Secondary | ICD-10-CM | POA: Diagnosis not present

## 2020-02-23 MED ORDER — ZIPRASIDONE HCL 20 MG PO CAPS: 20.0000 mg | ORAL_CAPSULE | Freq: Every day | ORAL | 0 refills | Status: DC

## 2020-02-23 NOTE — Progress Notes (Signed)
Provider Location : [facility] Patient Location : Home  Virtual Visit via Video Note  I connected with Ryan Ellis on 02/23/20 at  8:45 AM EDT by a video enabled telemedicine application and verified that I am speaking with the correct person using two identifiers.   I discussed the limitations of evaluation and management by telemedicine and the availability of in person appointments. The patient expressed understanding and agreed to proceed.      I discussed the assessment and treatment plan with the patient. The patient was provided an opportunity to ask questions and all were answered. The patient agreed with the plan and demonstrated an understanding of the instructions.   The patient was advised to call back or seek an in-person evaluation if the symptoms worsen or if the condition fails to improve as anticipated.  BH MD OP Progress Note  02/23/2020 9:05 PM Ryan Ellis  MRN:  [id number]  Chief Complaint:  Chief Complaint    Follow-up     HPI: Ryan Ellis is a 29 year old Caucasian male, single, lives in [place], has a history of MDD, GAD, autism spectrum disorder, insomnia, OSA on CPAP, hypertension, morbid obesity was evaluated by telemedicine today.  Patient today reports he lost his job recently.  They lost their government funding which led to lay off.  Patient reports he however is coping okay.  He is currently planning to relocate to [facility] [place] [facility] to be closer to his sister.  He also may have to change his providers since he may not have his health insurance plan anymore.  Patient reports he is compliant on medications as prescribed.  He denies any significant depression or mood lability.  He denies side effects to medications.  He reports sleep and appetite is fair.  He denies any suicidality, homicidality or perceptual disturbances.  He denies any other concerns today.   Visit Diagnosis:    ICD-10-CM   1. MDD (major depressive disorder), recurrent, in full remission (HCC)   F33.42 ziprasidone (GEODON) 20 MG capsule  2. GAD (generalized anxiety disorder)  F41.1   3. Autism spectrum disorder  F84.0 ziprasidone (GEODON) 20 MG capsule  4. Insomnia due to mental condition  F51.05     Past Psychiatric History: I have reviewed past psychiatric history from my progress note on 05/14/2018.  Past trials of Zoloft, Depakote, Lamictal-rash, lithium-thyroid problem, Abilify, propranolol.  Past Medical History:  Past Medical History:  Diagnosis Date  . Anxiety   . Autism   . Depression   . Hypertension   . Obesity   . Sleep apnea     Past Surgical History:  Procedure Laterality Date  . NO PAST SURGERIES      Family Psychiatric History: I have reviewed family psychiatric history from my progress note on 05/14/2018.  Family History:  Family History  Problem Relation Age of Onset  . Healthy Mother   . Alcohol abuse Mother   . Anxiety disorder Mother   . Depression Mother   . Seizures Father   . Hypertension Father   . Anxiety disorder Father   . Depression Father   . Heart attack Maternal Grandmother   . Diabetes Maternal Grandmother   . Heart attack Maternal Grandfather   . Anxiety disorder Sister   . Depression Sister     Social History: Reviewed social history from my progress note on 05/14/2018. Social History   Socioeconomic History  . Marital status: Single    Spouse name: Not on file  . Number of children: 0  .  Years of education: Not on file  . Highest education level: Associate degree: occupational, Hotel manager, or vocational program  Occupational History  . Not on file  Tobacco Use  . Smoking status: Never Smoker  . Smokeless tobacco: Never Used  Vaping Use  . Vaping Use: Never used  Substance and Sexual Activity  . Alcohol use: Not Currently    Alcohol/week: 1.0 standard drink    Types: 1 Cans of beer per week  . Drug use: Never  . Sexual activity: Not Currently  Other Topics Concern  . Not on file  Social History Narrative  .  Not on file   Social Determinants of Health   Financial Resource Strain:   . Difficulty of Paying Living Expenses:   Food Insecurity:   . Worried About Charity fundraiser in the Last Year:   . Arboriculturist in the Last Year:   Transportation Needs:   . Film/video editor (Medical):   Marland Kitchen Lack of Transportation (Non-Medical):   Physical Activity:   . Days of Exercise per Week:   . Minutes of Exercise per Session:   Stress:   . Feeling of Stress :   Social Connections:   . Frequency of Communication with Friends and Family:   . Frequency of Social Gatherings with Friends and Family:   . Attends Religious Services:   . Active Member of Clubs or Organizations:   . Attends Archivist Meetings:   Marland Kitchen Marital Status:     Allergies:  Allergies  Allergen Reactions  . Amoxicillin Hives  . Cat Hair Extract     Other reaction(s): Unknown  . Cephalexin Hives  . Lamotrigine     Other reaction(s): Unknown SEVERE RASH THAT WAS LIKE SJ SYNDROME  . Lithium     Other reaction(s): Unknown Thyroid issues - increased?  . Penicillins Rash    Unsure    Metabolic Disorder Labs: Lab Results  Component Value Date   HGBA1C 5.5 11/24/2019   No results found for: PROLACTIN Lab Results  Component Value Date   CHOL 196 11/24/2019   TRIG 132 11/24/2019   HDL 40 11/24/2019   CHOLHDL 4.9 11/24/2019   LDLCALC 132 (H) 11/24/2019   LDLCALC 141 (H) 09/30/2018   Lab Results  Component Value Date   TSH 2.690 11/24/2019   TSH 2.640 09/30/2018    Therapeutic Level Labs: No results found for: LITHIUM No results found for: VALPROATE No components found for:  CBMZ  Current Medications: Current Outpatient Medications  Medication Sig Dispense Refill  . amLODipine (NORVASC) 10 MG tablet Take 1 tablet (10 mg total) by mouth daily. 90 tablet 3  . lisinopril-hydrochlorothiazide (ZESTORETIC) 20-25 MG tablet Take 1 tablet by mouth daily. 90 tablet 3  . propranolol (INDERAL) 10 MG  tablet Take 1 tablet (10 mg total) by mouth 2 (two) times daily as needed. Only for severe anxiety attacks 60 tablet 1  . sertraline (ZOLOFT) 100 MG tablet Take 1 tablet (100 mg total) by mouth daily. To be combined with 25 mg 90 tablet 0  . sertraline (ZOLOFT) 25 MG tablet Take 1 tablet (25 mg total) by mouth daily. Combine with 100 mg 90 tablet 0  . ziprasidone (GEODON) 20 MG capsule Take 1 capsule (20 mg total) by mouth daily with supper. 90 capsule 0   No current facility-administered medications for this visit.     Musculoskeletal: Strength & Muscle Tone: UTA Gait & Station: normal Patient leans: N/A  Psychiatric  Specialty Exam: Review of Systems  Psychiatric/Behavioral: Negative for agitation, behavioral problems, confusion, decreased concentration, dysphoric mood, hallucinations, self-injury, sleep disturbance and suicidal ideas. The patient is not nervous/anxious and is not hyperactive.   All other systems reviewed and are negative.   There were no vitals taken for this visit.There is no height or weight on file to calculate BMI.  General Appearance: Casual  Eye Contact:  Fair  Speech:  Normal Rate  Volume:  Normal  Mood:  Euthymic  Affect:  Congruent  Thought Process:  Goal Directed and Descriptions of Associations: Intact  Orientation:  Full (Time, Place, and Person)  Thought Content: Logical   Suicidal Thoughts:  No  Homicidal Thoughts:  No  Memory:  Immediate;   Fair Recent;   Fair Remote;   Fair  Judgement:  Fair  Insight:  Fair  Psychomotor Activity:  Normal  Concentration:  Concentration: Fair and Attention Span: Fair  Recall:  [employer] of Knowledge: Fair  Language: Fair  Akathisia:  No  Handed:  Right  AIMS (if indicated): UTA  Assets:  Communication Skills Desire for Improvement Housing Social Support  ADL's:  Intact  Cognition: WNL  Sleep:  Fair   Screenings: PHQ2-9     Video Visit from [facility]08/2020 in [facility]  Office Visit from 04/10/2019 in [facility] Office Visit from 09/30/2018 in [facility] Office Visit from 03/05/2018 in [facility]  PHQ-2 Total Score 2 2 1 4   PHQ-9 Total Score 8 5 2 17        Assessment and Plan: Ryan Ellis is a 29 year old Caucasian male, single, lives in [place], employed, has a history of depression, anxiety, autism spectrum disorder, OSA on CPAP, hypertension, morbid obesity, was evaluated by telemedicine today.  Patient is biologically predisposed given his multiple health problems, autism spectrum disorder, family history of mental health problems.  Patient with psychosocial stressors of weight gain, current pandemic and job loss.  Patient however is stable on medications.  Plan as noted below.  Plan MDD in full remission Zoloft 125 mg p.o. daily Continue Geodon 20 mg p.o. daily with supper  GAD-improving Zoloft 125 mg p.o. daily. Propranolol 10 mg p.o. daily as needed for anxiety attacks  Insomnia-stable Patient is compliant with CPAP for OSA  Panic attacks-stable Continue CBT as needed  Patient advised to sign a release of information to release medical records to his new provider since he is transitioning to a new clinic.  Follow-up in clinic as needed.  I have spent atleast 20 minutes non face to face with patient today. More than 50 % of the time was spent for preparing to see the patient ( e.g., review of test, records ) , ordering medications and test ,psychoeducation and supportive psychotherapy and care coordination,as well as documenting clinical information in electronic health record. This note was generated in part or whole with voice recognition software. Voice recognition is usually quite accurate but there are transcription errors that can and very often do occur. I apologize for any typographical errors that were not detected and corrected.      34, MD 02/23/2020, 9:05 PM

## 2020-02-24 ENCOUNTER — Ambulatory Visit (INDEPENDENT_AMBULATORY_CARE_PROVIDER_SITE_OTHER): Payer: 59 | Admitting: Psychology

## 2020-02-24 DIAGNOSIS — F3289 Other specified depressive episodes: Secondary | ICD-10-CM

## 2020-02-25 NOTE — Progress Notes (Signed)
Established patient visit   Patient: Ryan Ellis   DOB: [birth year]   29 y.o. Male  MRN: [id number] Visit Date: 02/26/2020  Today's healthcare provider: Trinna Post, PA-C   Chief Complaint  Patient presents with  . Anxiety  . Depression   [employer] as a scribe for [employer], PA-C.,have documented all relevant documentation on the behalf of Trinna Post, PA-C,as directed by  Trinna Post, PA-C while in the presence of Trinna Post, PA-C.  Subjective    HPI Anxiety, Follow-up  He was last seen for anxiety 4 months ago. Changes made at last visit include no change.   He reports good compliance with treatment. He reports good tolerance of treatment. He is not having side effects.   He feels his anxiety is severe and Worse since last visit.  Symptoms: No chest pain No difficulty concentrating  No dizziness No fatigue  No feelings of losing control Yes insomnia  Yes irritable No palpitations  Yes panic attacks Yes racing thoughts  No shortness of breath Yes sweating  No tremors/shakes    GAD-7 Results GAD-7 Generalized Anxiety Disorder Screening Tool 02/26/2020  1. Feeling Nervous, Anxious, or on Edge 2  2. Not Being Able to Stop or Control Worrying 2  3. Worrying Too Much About Different Things 2  4. Trouble Relaxing 1  5. Being So Restless it's Hard To Sit Still 0  6. Becoming Easily Annoyed or Irritable 2  7. Feeling Afraid As If Something Awful Might Happen 2  Total GAD-7 Score 11  Difficulty At Work, Home, or Getting  Along With Others? Somewhat difficult    PHQ-9 Scores PHQ9 SCORE ONLY 02/26/2020 09/30/2018 03/05/2018  PHQ-9 Total Score 13 2 17   Some encounter information is confidential and restricted. Go to Review Flowsheets activity to see all data.    -----------------------------------------------------------------------------------------------  Depression, Follow-up  He  was last seen for this 4 months  ago. Changes made at last visit include no change.   He reports good compliance with treatment. He is not having side effects.   He reports good tolerance of treatment. Current symptoms include: depressed mood, feelings of worthlessness/guilt, hopelessness, insomnia and suicidal thoughts without plan He feels he is Worse since last visit.  Depression screen [facility] 2/9 02/26/2020 09/30/2018 03/05/2018  Decreased Interest 1 1 2   Down, Depressed, Hopeless 2 0 2  PHQ - 2 Score 3 1 4   Altered sleeping 3 0 3  Tired, decreased energy 2 0 3  Change in appetite 2 1 2   Feeling bad or failure about yourself  3 0 3  Trouble concentrating 0 0 1  Moving slowly or fidgety/restless 0 0 0  Suicidal thoughts 0 0 1  PHQ-9 Score 13 2 17   Difficult doing work/chores Somewhat difficult Very difficult Somewhat difficult  Some encounter information is confidential and restricted. Go to Review Flowsheets activity to see all data.    ----------------------------------------------------------------------------------------- Patient is requesting FMLA forms be completed. Patient is no longer employed by employer. He was terminated on 02/24/2020. Reports he was under a lot of stress due to originally being told he was laid off but ultimately terminated. He made a specific comment which prompted his employer to call the police to do a welfare check.   Ultimately he is planning to move to [place], [place] to be near his sister. Requesting letter explaining medical conditions.      Medications: Outpatient Medications Prior to Visit  Medication Sig  .  propranolol (INDERAL) 10 MG tablet Take 1 tablet (10 mg total) by mouth 2 (two) times daily as needed. Only for severe anxiety attacks  . sertraline (ZOLOFT) 100 MG tablet Take 1 tablet (100 mg total) by mouth daily. To be combined with 25 mg  . sertraline (ZOLOFT) 25 MG tablet Take 1 tablet (25 mg total) by mouth daily. Combine with 100 mg  . ziprasidone (GEODON) 20 MG capsule  Take 1 capsule (20 mg total) by mouth daily with supper.  . [DISCONTINUED] amLODipine (NORVASC) 10 MG tablet Take 1 tablet (10 mg total) by mouth daily.  . [DISCONTINUED] lisinopril-hydrochlorothiazide (ZESTORETIC) 20-25 MG tablet Take 1 tablet by mouth daily.   No facility-administered medications prior to visit.    Review of Systems  Constitutional: Negative.   Respiratory: Negative.   Cardiovascular: Negative.   Musculoskeletal: Negative.     {Heme  Chem  Endocrine  Serology  Results Review (optional):23779::" "}  Objective    BP (!) 157/84 (BP Location: Right Arm, Patient Position: Sitting, Cuff Size: Large)   Pulse (!) 103   Temp (!) 96.2 F (35.7 C) (Temporal)   Ht 6' (1.829 m)   Wt (!) 471 lb 8 oz (213.9 kg)   BMI 63.95 kg/m    Physical Exam Constitutional:      Appearance: Normal appearance.  Cardiovascular:     Rate and Rhythm: Normal rate and regular rhythm.     Pulses: Normal pulses.     Heart sounds: Normal heart sounds.  Pulmonary:     Effort: Pulmonary effort is normal.     Breath sounds: Normal breath sounds.  Skin:    General: Skin is warm and dry.  Neurological:     General: No focal deficit present.     Mental Status: He is alert and oriented to person, place, and time.  Psychiatric:        Mood and Affect: Mood normal.        Behavior: Behavior normal.      No results found for any visits on 02/26/20.  Assessment & Plan    1. Essential hypertension  - amLODipine (NORVASC) 10 MG tablet; Take 1 tablet (10 mg total) by mouth daily.  Dispense: 90 tablet; Refill: 0 - lisinopril-hydrochlorothiazide (ZESTORETIC) 20-25 MG tablet; Take 1 tablet by mouth daily.  Dispense: 90 tablet; Refill: 0  2. MDD (major depressive disorder), recurrent, in partial remission (HCC)   3. GAD (generalized anxiety disorder)     No follow-ups on file.      ITrey Sailors, PA-C, have reviewed all documentation for this visit. The documentation on  03/25/20 for the exam, diagnosis, procedures, and orders are all accurate and complete.  I have spent 25 minutes with this patient, >50% of which was spent on counseling and coordination of care.     Maryella Shivers  [facility] [phone] (phone) [phone] (fax)  [facility]

## 2020-02-26 ENCOUNTER — Other Ambulatory Visit: Payer: Self-pay

## 2020-02-26 ENCOUNTER — Ambulatory Visit: Payer: Self-pay | Admitting: Physician Assistant

## 2020-02-26 ENCOUNTER — Encounter: Payer: Self-pay | Admitting: Physician Assistant

## 2020-02-26 DIAGNOSIS — F411 Generalized anxiety disorder: Secondary | ICD-10-CM | POA: Diagnosis not present

## 2020-02-26 DIAGNOSIS — F3341 Major depressive disorder, recurrent, in partial remission: Secondary | ICD-10-CM | POA: Diagnosis not present

## 2020-02-26 DIAGNOSIS — I10 Essential (primary) hypertension: Secondary | ICD-10-CM

## 2020-03-25 MED ORDER — LISINOPRIL-HYDROCHLOROTHIAZIDE 20-25 MG PO TABS: 1.0000 | ORAL_TABLET | Freq: Every day | ORAL | 0 refills | Status: DC

## 2020-03-25 MED ORDER — AMLODIPINE BESYLATE 10 MG PO TABS: 10.0000 mg | ORAL_TABLET | Freq: Every day | ORAL | 0 refills | Status: DC

## 2020-04-05 NOTE — Progress Notes (Deleted)
     Established patient visit   Patient: Ryan Ellis   DOB: 07/30/1991   29 y.o. Male  MRN: [id number] Visit Date: 04/08/2020  Today's healthcare provider: Trey Sailors, PA-C   No chief complaint on file.  Subjective    HPI  ***  {Show patient history (optional):23778::" "}   Medications: Outpatient Medications Prior to Visit  Medication Sig  . amLODipine (NORVASC) 10 MG tablet Take 1 tablet (10 mg total) by mouth daily.  Marland Kitchen lisinopril-hydrochlorothiazide (ZESTORETIC) 20-25 MG tablet Take 1 tablet by mouth daily.  . propranolol (INDERAL) 10 MG tablet Take 1 tablet (10 mg total) by mouth 2 (two) times daily as needed. Only for severe anxiety attacks  . sertraline (ZOLOFT) 100 MG tablet Take 1 tablet (100 mg total) by mouth daily. To be combined with 25 mg  . sertraline (ZOLOFT) 25 MG tablet Take 1 tablet (25 mg total) by mouth daily. Combine with 100 mg  . ziprasidone (GEODON) 20 MG capsule Take 1 capsule (20 mg total) by mouth daily with supper.   No facility-administered medications prior to visit.    Review of Systems  {Heme  Chem  Endocrine  Serology  Results Review (optional):23779::" "}  Objective    There were no vitals taken for this visit. {Show previous vital signs (optional):23777::" "}  Physical Exam  ***  No results found for any visits on 04/08/20.  Assessment & Plan     ***  No follow-ups on file.      {provider attestation***:1}   Maryella Shivers  [facility] [phone] (phone) [phone] (fax)  [facility]

## 2020-04-08 ENCOUNTER — Ambulatory Visit: Payer: 59 | Admitting: Physician Assistant

## 2020-05-20 ENCOUNTER — Other Ambulatory Visit: Payer: Self-pay | Admitting: Physician Assistant

## 2020-05-20 DIAGNOSIS — I10 Essential (primary) hypertension: Secondary | ICD-10-CM

## 2020-05-20 NOTE — Telephone Encounter (Signed)
Requested Prescriptions  Pending Prescriptions Disp Refills  . amLODipine (NORVASC) 10 MG tablet [Pharmacy Med Name: amLODIPine BESYLATE 10 MG TAB] 90 tablet 0    Sig: TAKE ONE TABLET BY MOUTH DAILY     Cardiovascular:  Calcium Channel Blockers Failed - 05/20/2020 11:40 AM      Failed - Last BP in normal range    BP Readings from Last 1 Encounters:  02/26/20 (!) 157/84         Passed - Valid encounter within last 6 months    Recent Outpatient Visits          2 months ago Essential hypertension   [employer], Adriana M, PA-C   7 months ago GAD (generalized anxiety disorder)   [facility] Osvaldo Angst M, PA-C   9 months ago Need for influenza vaccination   [facility] [place], Ricki Rodriguez M, PA-C   1 year ago Morbid obesity [facility])   [facility] [place], Ricki Rodriguez M, [place]   1 year ago Annual physical exam   [facility] Jodi Marble, Adriana M, PA-C             . lisinopril-hydrochlorothiazide (ZESTORETIC) 20-25 MG tablet [Pharmacy Med Name: LISINOPRIL-HCTZ 20-25 MG TAB] 90 tablet 0    Sig: TAKE ONE TABLET BY MOUTH DAILY     Cardiovascular:  ACEI + Diuretic Combos Failed - 05/20/2020 11:40 AM      Failed - Cr in normal range and within 180 days    Creatinine, Ser  Date Value Ref Range Status  11/24/2019 0.74 (L) 0.76 - 1.27 mg/dL Final         Failed - Last BP in normal range    BP Readings from Last 1 Encounters:  02/26/20 (!) 157/84         Passed - Na in normal range and within 180 days    Sodium  Date Value Ref Range Status  11/24/2019 137 134 - 144 mmol/L Final         Passed - K in normal range and within 180 days    Potassium  Date Value Ref Range Status  11/24/2019 4.3 3.5 - 5.2 mmol/L Final         Passed - Ca in normal range and within 180 days    Calcium  Date Value Ref Range Status  11/24/2019 9.2 8.7 - 10.2 mg/dL Final         Passed - Patient is not pregnant      Passed -  Valid encounter within last 6 months    Recent Outpatient Visits          2 months ago Essential hypertension   [facility] Osvaldo Angst M, PA-C   7 months ago GAD (generalized anxiety disorder)   [facility] Osvaldo Angst M, [place]   9 months ago Need for influenza vaccination   [facility] [place], Lavella Hammock, [place]   1 year ago Morbid obesity [facility])   [facility], Lavella Hammock, [place]   1 year ago Annual physical exam   [facility] Angst M, [place]

## 2020-07-18 IMAGING — RF DG UGI W/ HIGH DENSITY W/O KUB
9 of 12 series · 15 of 24 positions shown · non-contrast
Comparison: None.

CLINICAL DATA: Preparation for gastric bypass surgery.

EXAM:
UPPER GI SERIES WITHOUT KUB
TECHNIQUE: Routine upper GI series was performed with thin and thick barium.
FLUOROSCOPY TIME:  Fluoroscopy Time:  1 minutes 54 seconds
Number of Acquired Spot Images: 0

[Series 1: cp_standard · 0.25mm/px · 2 of 168 frames shown (1 of 9)]
[frame 9/168]
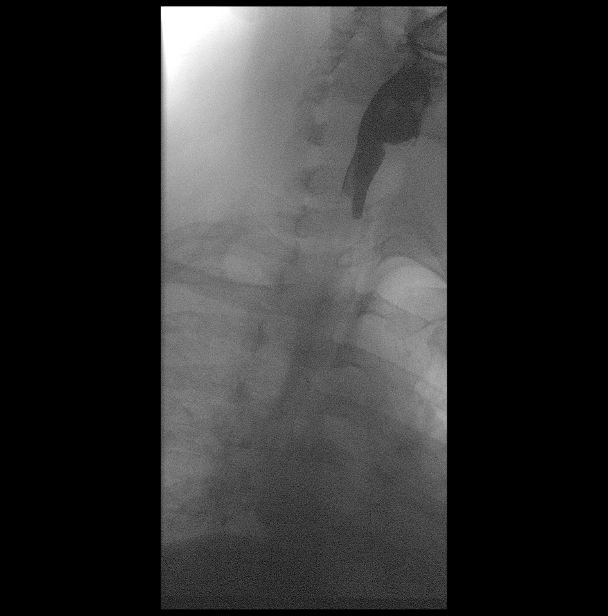
[frame 143/168]
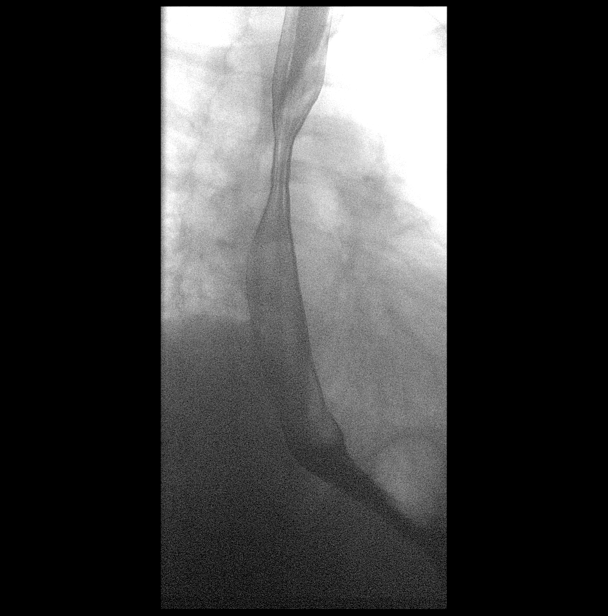

[Series 2: cp_standard · 0.25mm/px · 2 of 88 frames shown (2 of 9)]
[frame 45/88]
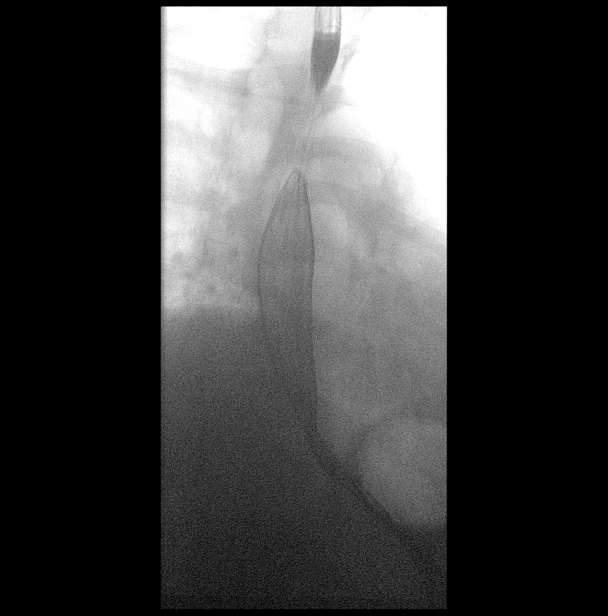
[frame 75/88]
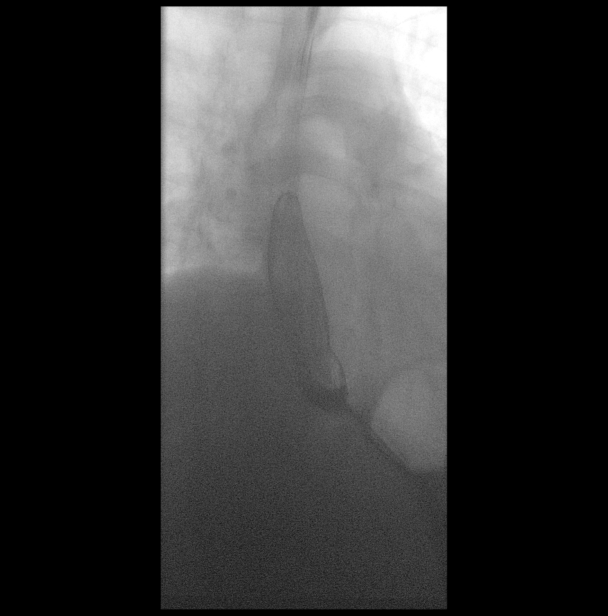

[Series 5: cp_standard · 0.26mm/px · 1 of 1 slices shown (3 of 9)]
[im 1/1]
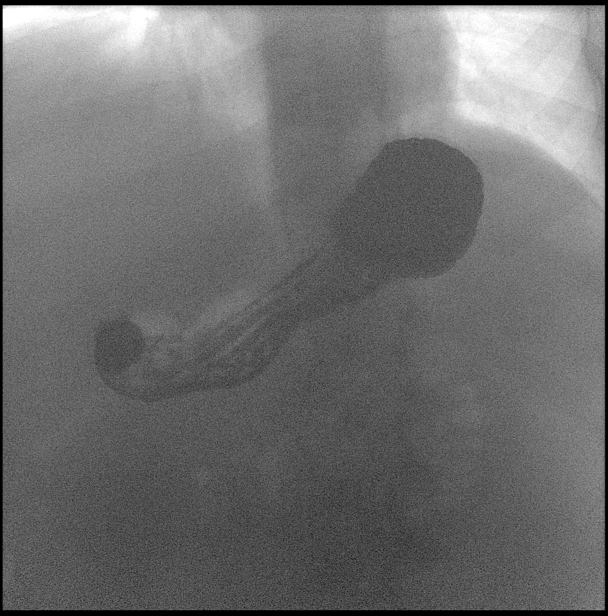

[Series 6: cp_standard · 0.26mm/px · 1 of 1 slices shown (4 of 9)]
[im 1/1]
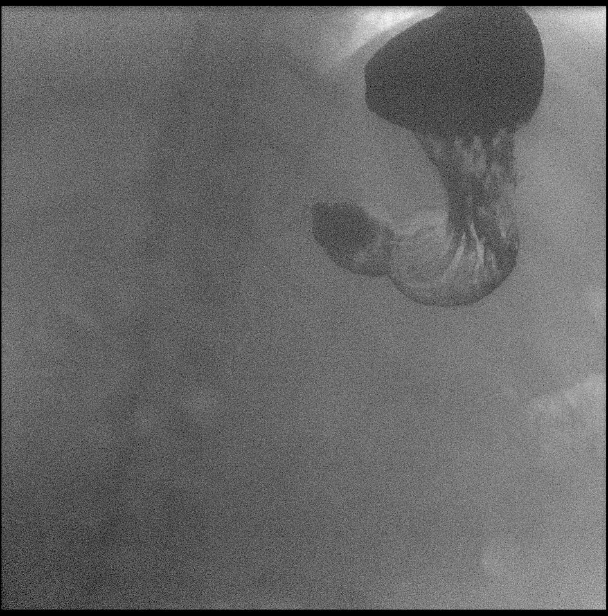

[Series 9: cp_standard · 0.26mm/px · 1 of 1 slices shown (5 of 9)]
[im 1/1]
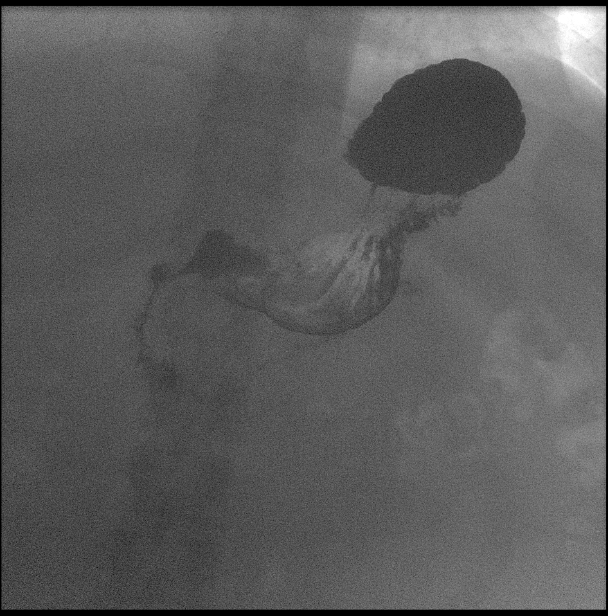

[Series 12: cp_standard · 0.26mm/px · 2 of 143 frames shown (6 of 9)]
[frame 22/143]
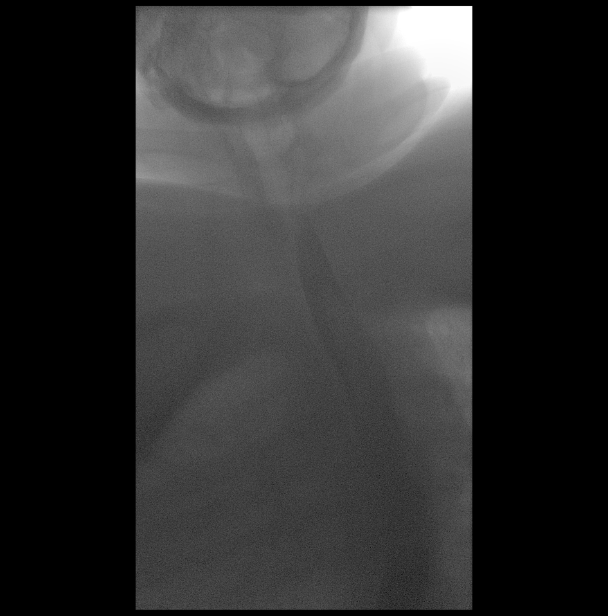
[frame 72/143]
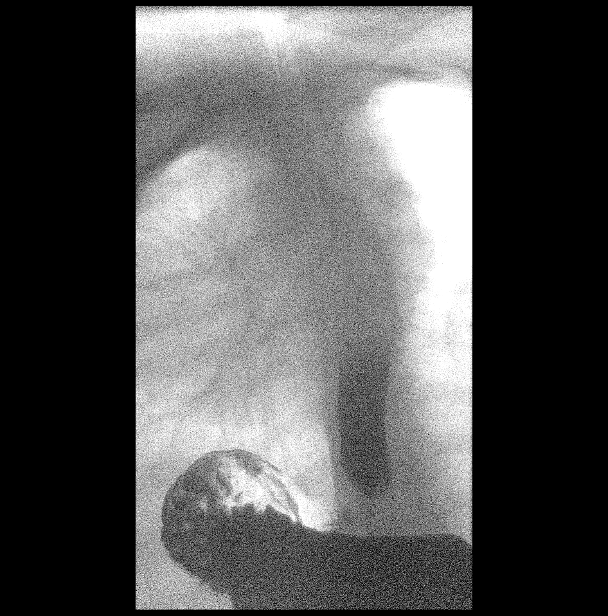

[Series 13: cp_standard · 0.26mm/px · 2 of 235 frames shown (7 of 9)]
[frame 36/235]
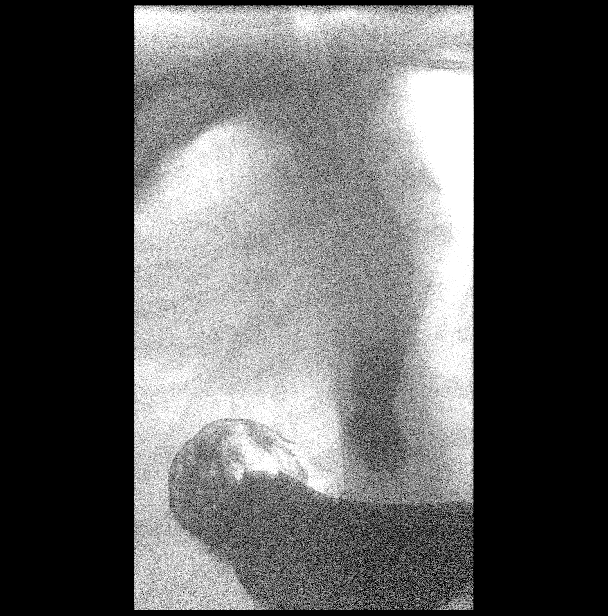
[frame 55/235]
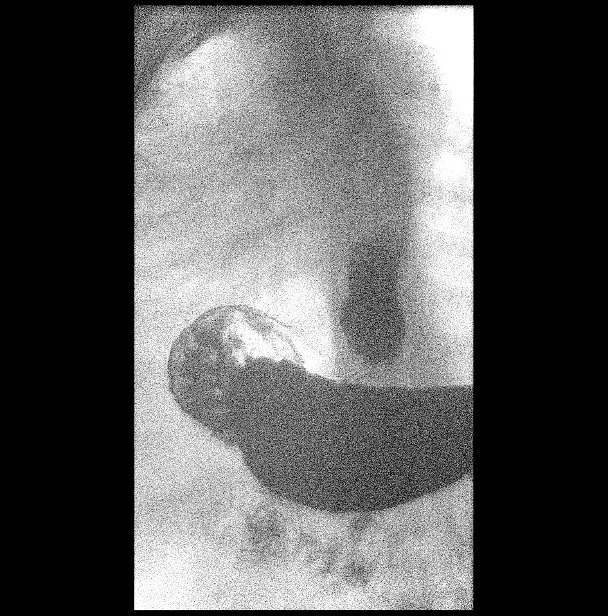

[Series 14: cp_standard · 0.26mm/px · 2 of 153 frames shown (8 of 9)]
[frame 23/153]
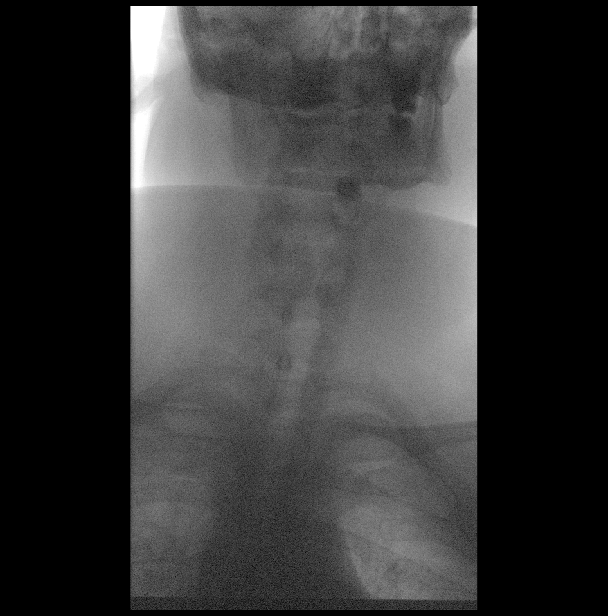
[frame 131/153]
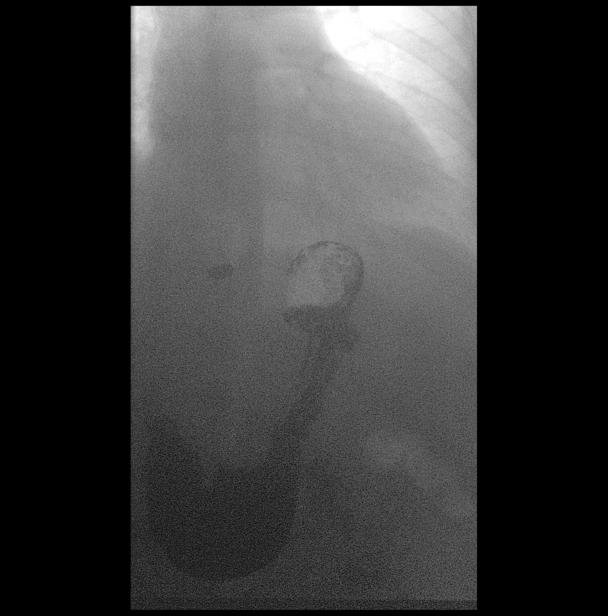

[Series 15: cp_standard · 0.26mm/px · 2 of 47 frames shown (9 of 9)]
[frame 8/47]
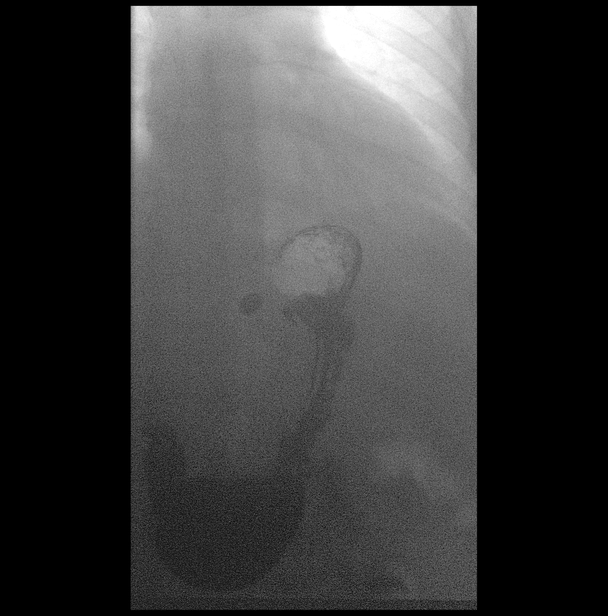
[frame 40/47]
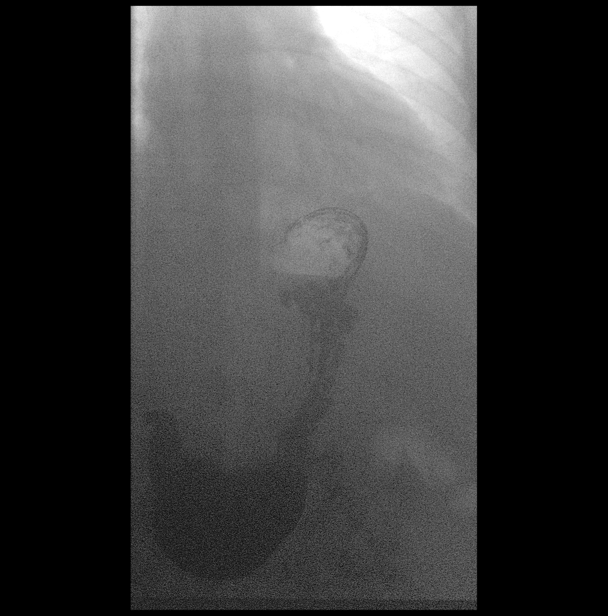

[15 of 24 positions shown; findings below may reference images not displayed]

FINDINGS: The esophagus, stomach, and small bowel are normal on this study. No
masses, strictures, or hernias. Esophageal motility is normal.
Gastric motility is unremarkable. The duodenum is in good position
with normal motility. The patient was given a barium tablet which
passed normally.
IMPRESSION: Normal study.

## 2020-07-21 ENCOUNTER — Other Ambulatory Visit: Payer: Self-pay | Admitting: Physician Assistant

## 2020-07-21 DIAGNOSIS — I10 Essential (primary) hypertension: Secondary | ICD-10-CM

## 2020-07-23 MED ORDER — AMLODIPINE BESYLATE 10 MG PO TABS: 10.0000 mg | ORAL_TABLET | Freq: Every day | ORAL | 0 refills | Status: DC

## 2020-07-23 MED ORDER — LISINOPRIL-HYDROCHLOROTHIAZIDE 20-25 MG PO TABS: 1.0000 | ORAL_TABLET | Freq: Every day | ORAL | 0 refills | Status: DC

## 2020-07-23 NOTE — Telephone Encounter (Signed)
L.O.V. was on 02/26/2020 and no upcoming appointment.

## 2020-07-27 ENCOUNTER — Telehealth: Payer: Self-pay

## 2020-07-27 NOTE — Telephone Encounter (Signed)
Copied from CRM [id number]. Topic: General - Inquiry >> Jul 27, 2020  2:10 PM Ryan Ellis wrote: Reason for CRM: pt had appt 6/24 and his insurance was terminated 6/22.  Pt got a bill for over $200.  Is there anyway to go back and bill at the self pay rate? Can you all do it?  If not, who does he need to contact ?  thanks

## 2020-07-27 NOTE — Telephone Encounter (Signed)
I called patient and left him a message to let him know that I changed this visit to self pay and it should have fixed it.  I provided the billing departments number if he has further questions.

## 2020-07-27 NOTE — Telephone Encounter (Signed)
Who can the patient contact for this? Thanks!

## 2020-07-28 ENCOUNTER — Telehealth (INDEPENDENT_AMBULATORY_CARE_PROVIDER_SITE_OTHER): Payer: 59 | Admitting: Physician Assistant

## 2020-07-28 DIAGNOSIS — G4730 Sleep apnea, unspecified: Secondary | ICD-10-CM

## 2020-07-28 DIAGNOSIS — F3342 Major depressive disorder, recurrent, in full remission: Secondary | ICD-10-CM

## 2020-07-28 DIAGNOSIS — F411 Generalized anxiety disorder: Secondary | ICD-10-CM

## 2020-07-28 DIAGNOSIS — F330 Major depressive disorder, recurrent, mild: Secondary | ICD-10-CM | POA: Diagnosis not present

## 2020-07-28 DIAGNOSIS — F840 Autistic disorder: Secondary | ICD-10-CM

## 2020-07-28 DIAGNOSIS — F5105 Insomnia due to other mental disorder: Secondary | ICD-10-CM

## 2020-07-28 DIAGNOSIS — I10 Essential (primary) hypertension: Secondary | ICD-10-CM | POA: Diagnosis not present

## 2020-07-28 MED ORDER — ZIPRASIDONE HCL 20 MG PO CAPS: 20.0000 mg | ORAL_CAPSULE | Freq: Every day | ORAL | 0 refills | Status: DC

## 2020-07-28 MED ORDER — SERTRALINE HCL 100 MG PO TABS: 100.0000 mg | ORAL_TABLET | Freq: Every day | ORAL | 1 refills | Status: AC

## 2020-07-28 MED ORDER — SERTRALINE HCL 25 MG PO TABS: 25.0000 mg | ORAL_TABLET | Freq: Every day | ORAL | 1 refills | Status: AC

## 2020-07-28 NOTE — Progress Notes (Signed)
MyChart Video Visit    Virtual Visit via Video Note   This visit type was conducted due to national recommendations for restrictions regarding the COVID-19 Pandemic (e.g. social distancing) in an effort to limit this patient's exposure and mitigate transmission in our community. This patient is at least at moderate risk for complications without adequate follow up. This format is felt to be most appropriate for this patient at this time. Physical exam was limited by quality of the video and audio technology used for the visit.   Patient location: Home Provider location: Office   I discussed the limitations of evaluation and management by telemedicine and the availability of in person appointments. The patient expressed understanding and agreed to proceed.  Patient: Ryan Ellis   DOB: [birth year]   29 y.o. Male  MRN: [id number] Visit Date: 07/28/2020  Today's healthcare provider: Trey Sailors, PA-C   Chief Complaint  Patient presents with  . Depression  . 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 Moved to [place], [place] and is working on getting established with provider there.  Depression, Follow-up  He  was last seen for this 6 months ago. Changes made at last visit include continue zoloft 125 mg daily and geodon 20 mg daily.   He reports good compliance with treatment. He is not having side effects.   He reports good tolerance of treatment. Current symptoms include: depressed mood He feels he is Unchanged since last visit. He was seen by psychiatrist in 02/2020. However, due to not being seen for six months he was dropped from the practice and when he went to make an appointment, was told the provider was not accepting new patients.   Depression screen [facility] 2/9 02/26/2020 09/30/2018 03/05/2018  Decreased Interest 1 1 2   Down, Depressed, Hopeless 2 0 2  PHQ - 2 Score 3 1 4   Altered sleeping 3 0 3  Tired, decreased energy 2 0 3    Change in appetite 2 1 2   Feeling bad or failure about yourself  3 0 3  Trouble concentrating 0 0 1  Moving slowly or fidgety/restless 0 0 0  Suicidal thoughts 0 0 1  PHQ-9 Score 13 2 17   Difficult doing work/chores Somewhat difficult Very difficult Somewhat difficult  Some encounter information is confidential and restricted. Go to Review Flowsheets activity to see all data.    ----------------------------------------------------------------------------------------- Hypertension, follow-up  BP Readings from Last 3 Encounters:  02/26/20 (!) 157/84  10/10/19 118/78  08/05/19 134/87   Wt Readings from Last 3 Encounters:  02/26/20 (!) 471 lb 8 oz (213.9 kg)  11/26/19 (!) 473 lb 11.2 oz (214.9 kg)  11/13/19 (!) 478 lb (216.8 kg)     He was last seen for hypertension 6 months ago.  BP at that visit was normal. Management since that visit includes continue amlodipine 10 mg daily and zesoteric 20-25 mg daily.  He reports excellent compliance with treatment. He is not having side effects.  He is following a Regular diet. He is not exercising. He does not smoke.  Use of agents associated with hypertension: none.   Patient has previously looked into bariatric surgery however due to insurance gaps, was unable to proceed with surgery. He looks forward to restarting this process with his new PCP.   Outside blood pressures are not checked. Symptoms: No chest pain No chest pressure  No palpitations No syncope  No dyspnea No orthopnea  No paroxysmal nocturnal dyspnea No lower extremity edema   Pertinent labs: Lab Results  Component Value Date   CHOL 196 11/24/2019   HDL 40 11/24/2019   LDLCALC 132 (H) 11/24/2019   TRIG 132 11/24/2019   CHOLHDL 4.9 11/24/2019   Lab Results  Component Value Date   NA 137 11/24/2019   K 4.3 11/24/2019   CREATININE 0.74 (L) 11/24/2019   GFRNONAA 126 11/24/2019   GFRAA 146 11/24/2019   GLUCOSE 81 11/24/2019     The ASCVD Risk score (Goff DC  Jr., et al., 2013) failed to calculate for the following reasons:   The 2013 ASCVD risk score is only valid for ages 27 to 9   ---------------------------------------------------------------------------------------------------    Medications: Outpatient Medications Prior to Visit  Medication Sig  . amLODipine (NORVASC) 10 MG tablet Take 1 tablet (10 mg total) by mouth daily.  Marland Kitchen lisinopril-hydrochlorothiazide (ZESTORETIC) 20-25 MG tablet Take 1 tablet by mouth daily.  . propranolol (INDERAL) 10 MG tablet Take 1 tablet (10 mg total) by mouth 2 (two) times daily as needed. Only for severe anxiety attacks  . [DISCONTINUED] sertraline (ZOLOFT) 100 MG tablet Take 1 tablet (100 mg total) by mouth daily. To be combined with 25 mg  . [DISCONTINUED] sertraline (ZOLOFT) 25 MG tablet Take 1 tablet (25 mg total) by mouth daily. Combine with 100 mg  . [DISCONTINUED] ziprasidone (GEODON) 20 MG capsule Take 1 capsule (20 mg total) by mouth daily with supper.   No facility-administered medications prior to visit.    Review of Systems    Objective    There were no vitals taken for this visit.   Physical Exam Constitutional:      Appearance: Normal appearance.  Pulmonary:     Effort: Pulmonary effort is normal. No respiratory distress.  Neurological:     Mental Status: He is alert.  Psychiatric:        Mood and Affect: Mood normal.        Behavior: Behavior normal.        Assessment & Plan     1. MDD (major depressive disorder), recurrent episode, mild (HCC)  Have refilled zoloft and goedon. He is in the process of establishing with new PCP in [place]. He understands he should also look for a new psychiatrist as well.  2. Primary hypertension  Continue current medications. Patient plans on exploring bariatric surgery once his insurance is settled.  3. 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 To be combined with 25 mg  Dispense: 90 tablet; Refill: 1  5. GAD (generalized anxiety disorder)  - sertraline (ZOLOFT) 25 MG tablet; Take 1 tablet (25 mg total) by mouth daily. Combine with 100 mg  Dispense: 90 tablet; Refill: 1  6. MDD (major depressive disorder), recurrent, in full remission (HCC)  - ziprasidone (GEODON) 20 MG capsule; Take 1 capsule (20 mg total) by mouth daily with supper.  Dispense: 90 capsule; Refill: 0  7. Autism spectrum disorder  - ziprasidone (GEODON) 20 MG capsule; Take 1 capsule (20 mg total) by mouth daily with supper.  Dispense: 90 capsule; Refill: 0   No follow-ups on file.     I discussed the assessment and treatment plan with the patient. The patient was provided an opportunity to ask questions and all were answered. The patient agreed with the plan and demonstrated an understanding of the  instructions.   The patient was advised to call back or seek an in-person evaluation if the symptoms worsen or if the condition fails to improve as anticipated.   ITrey Sailors, PA-C, have reviewed all documentation for this visit. The documentation on 07/28/20 for the exam, diagnosis, procedures, and orders are all accurate and complete.  The entirety of the information documented in the History of Present Illness, Review of Systems and Physical Exam were personally obtained by me. Portions of this information were initially documented by [facility] and reviewed by me for thoroughness and accuracy.    Maryella Shivers [facility] [phone] (phone) [phone] (fax)  [facility]

## 2020-07-28 NOTE — Patient Instructions (Signed)

## 2020-11-01 ENCOUNTER — Other Ambulatory Visit: Payer: Self-pay | Admitting: Physician Assistant

## 2020-11-01 DIAGNOSIS — F3342 Major depressive disorder, recurrent, in full remission: Secondary | ICD-10-CM

## 2020-11-01 DIAGNOSIS — F840 Autistic disorder: Secondary | ICD-10-CM

## 2020-11-01 NOTE — Telephone Encounter (Signed)
Requested medication (s) are due for refill today:   Provider to review  Requested medication (s) are on the active medication list:   Yes  Future visit scheduled:   No   Last ordered: 07/28/2020 #90, 0 refills  Clinic note:   Returned because it's a non delegated refill   Requested Prescriptions  Pending Prescriptions Disp Refills   ziprasidone (GEODON) 20 MG capsule [Pharmacy Med Name: ZIPRASIDONE HCL 20 MG CAPSULE] 90 capsule 0    Sig: TAKE ONE CAPSULE BY MOUTH DAILY WITH SUPPER      Not Delegated - Psychiatry:  Antipsychotics - Second Generation (Atypical) - ziprasidone Failed - 11/01/2020  2:24 PM      Failed - This refill cannot be delegated      Failed - Valid encounter within last 6 months    Recent Outpatient Visits           3 months ago MDD (major depressive disorder), recurrent episode, mild [facility])   [facility] [place], [place], PA-C   8 months ago Essential hypertension   [facility] [place], [place], PA-C   1 year ago GAD (generalized anxiety disorder)   [facility] [place], [place], [place]   1 year ago Need for influenza vaccination   [facility] Trey Sailors, [place]   1 year ago Morbid obesity [facility])   [facility] [place], [place], [place]

## 2020-11-05 ENCOUNTER — Encounter: Payer: Self-pay | Admitting: Physician Assistant

## 2021-01-26 ENCOUNTER — Telehealth: Payer: Self-pay | Admitting: Family Medicine

## 2021-01-26 DIAGNOSIS — I10 Essential (primary) hypertension: Secondary | ICD-10-CM

## 2021-01-26 MED ORDER — LISINOPRIL-HYDROCHLOROTHIAZIDE 20-25 MG PO TABS: 1.0000 | ORAL_TABLET | Freq: Every day | ORAL | 0 refills | Status: AC

## 2021-01-26 MED ORDER — AMLODIPINE BESYLATE 10 MG PO TABS: 10.0000 mg | ORAL_TABLET | Freq: Every day | ORAL | 0 refills | Status: AC

## 2021-01-26 NOTE — Telephone Encounter (Signed)
Courtesy refill provided. Patient has appointment to establish care with [facility] on 02/01/2021.

## 2021-01-26 NOTE — Telephone Encounter (Signed)
[facility] faxed refill request for the following medications:  1. lisinopril-hydrochlorothiazide (ZESTORETIC) 20-25 MG tablet   2. amLODipine (NORVASC) 10 MG tablet  Last Rx: 07/23/20 LOV: 07/28/20  Looks like pt is scheduled to transfer care to [facility] 02/01/21. Please advise. Thanks TNP

## 2021-02-14 ENCOUNTER — Other Ambulatory Visit: Payer: Self-pay | Admitting: Family Medicine

## 2021-02-14 DIAGNOSIS — F5105 Insomnia due to other mental disorder: Secondary | ICD-10-CM

## 2021-02-14 DIAGNOSIS — F3342 Major depressive disorder, recurrent, in full remission: Secondary | ICD-10-CM

## 2021-02-14 DIAGNOSIS — F840 Autistic disorder: Secondary | ICD-10-CM

## 2021-02-14 NOTE — Telephone Encounter (Signed)
Requested medication (s) are due for refill today: yes   Requested medication (s) are on the active medication list: yes   Last refill: 11/01/2020  Future visit scheduled:  no  Notes to clinic: this refill cannot be delegated    Requested Prescriptions  Pending Prescriptions Disp Refills   ziprasidone (GEODON) 20 MG capsule 90 capsule 0    Sig: TAKE ONE CAPSULE BY MOUTH DAILY WITH SUPPER      Not Delegated - Psychiatry:  Antipsychotics - Second Generation (Atypical) - ziprasidone Failed - 02/14/2021  1:11 PM      Failed - This refill cannot be delegated      Failed - Valid encounter within last 6 months    Recent Outpatient Visits           6 months ago MDD (major depressive disorder), recurrent episode, mild (HCC)   [facility] [place], Ricki Rodriguez M, PA-C   11 months ago Essential hypertension   [employer], [place], PA-C   1 year ago GAD (generalized anxiety disorder)   [facility] [place], [place], [place]   1 year ago Need for influenza vaccination   [facility] Osvaldo Angst M, PA-C   1 year ago Morbid obesity [facility])   [facility] [place], [place] M, PA-C                  sertraline (ZOLOFT) 100 MG tablet 90 tablet 1    Sig: Take 1 tablet (100 mg total) by mouth daily. To be combined with 25 mg      Psychiatry:  Antidepressants - SSRI Failed - 02/14/2021  1:11 PM      Failed - Valid encounter within last 6 months    Recent Outpatient Visits           6 months ago MDD (major depressive disorder), recurrent episode, mild [facility])   [facility] Osvaldo Angst M, [place]   11 months ago Essential hypertension   [facility] [place], Lavella [place], PA-C   1 year ago GAD (generalized anxiety disorder)   [facility] [place], [place], [place]   1 year ago Need for influenza vaccination   [facility] Trey Sailors, PA-C   1 year ago  Morbid obesity [facility])   [facility] [place], Ricki Rodriguez M, [place]                Passed - Completed PHQ-2 or PHQ-9 in the last 360 days

## 2021-02-14 NOTE — Telephone Encounter (Signed)
Medication Refill - Medication:  ziprasidone (GEODON) 20 MG capsule  sertraline (ZOLOFT) 100 MG tablet  Has the patient contacted their pharmacy? No. Pt knows he has to call these medications in  Preferred Pharmacy (with phone number or street name):  [facility] [place], [place] - [address] Phone:  [phone]  Fax:  [phone]      Agent: Please be advised that RX refills may take up to 3 business days. We ask that you follow-up with your pharmacy.

## 2021-02-15 ENCOUNTER — Other Ambulatory Visit: Payer: Self-pay | Admitting: Family Medicine

## 2021-02-15 DIAGNOSIS — F840 Autistic disorder: Secondary | ICD-10-CM

## 2021-02-15 DIAGNOSIS — F3342 Major depressive disorder, recurrent, in full remission: Secondary | ICD-10-CM

## 2021-02-15 MED ORDER — ZIPRASIDONE HCL 20 MG PO CAPS: ORAL_CAPSULE | ORAL | 0 refills | Status: AC

## 2021-02-15 NOTE — Telephone Encounter (Signed)
Pt advised.   Thanks,   -Ryllie Nieland  

## 2021-02-15 NOTE — Telephone Encounter (Signed)
Patient has transferred to [facility]. He needs to contact their office for refills.

## 2021-03-14 ENCOUNTER — Other Ambulatory Visit: Payer: Self-pay | Admitting: Family Medicine

## 2021-03-14 DIAGNOSIS — I10 Essential (primary) hypertension: Secondary | ICD-10-CM

## 2021-05-13 ENCOUNTER — Other Ambulatory Visit: Payer: Self-pay | Admitting: Family Medicine

## 2021-05-13 DIAGNOSIS — F840 Autistic disorder: Secondary | ICD-10-CM

## 2021-05-13 DIAGNOSIS — F3342 Major depressive disorder, recurrent, in full remission: Secondary | ICD-10-CM
